# Patient Record
Sex: Female | Born: 1985 | Race: Asian | Hispanic: No | Marital: Married | Smoking: Never smoker
Health system: Southern US, Community
[De-identification: ages and names within clinical notes are randomized; demographics above are authoritative.]

## PROBLEM LIST (undated history)

## (undated) ENCOUNTER — Inpatient Hospital Stay (HOSPITAL_COMMUNITY): Payer: Self-pay

## (undated) DIAGNOSIS — Z331 Pregnant state, incidental: Secondary | ICD-10-CM

## (undated) DIAGNOSIS — Z789 Other specified health status: Secondary | ICD-10-CM

## (undated) HISTORY — PX: AUGMENTATION MAMMAPLASTY: SUR837

## (undated) HISTORY — PX: BREAST ENHANCEMENT SURGERY: SHX7

---

## 2010-05-03 HISTORY — PX: BREAST IMPLANT: SHX2716

## 2011-11-10 ENCOUNTER — Ambulatory Visit (INDEPENDENT_AMBULATORY_CARE_PROVIDER_SITE_OTHER): Payer: Exclusive Provider Organization | Admitting: Specialist

## 2011-11-10 ENCOUNTER — Encounter (INDEPENDENT_AMBULATORY_CARE_PROVIDER_SITE_OTHER): Payer: Self-pay | Admitting: Specialist

## 2011-11-10 VITALS — BP 104/56 | HR 80 | Temp 97.4°F | Ht 68.0 in | Wt 125.1 lb

## 2011-11-10 DIAGNOSIS — L259 Unspecified contact dermatitis, unspecified cause: Secondary | ICD-10-CM

## 2011-11-10 DIAGNOSIS — Z01419 Encounter for gynecological examination (general) (routine) without abnormal findings: Secondary | ICD-10-CM

## 2011-11-10 DIAGNOSIS — Z113 Encounter for screening for infections with a predominantly sexual mode of transmission: Secondary | ICD-10-CM

## 2011-11-10 DIAGNOSIS — B001 Herpesviral vesicular dermatitis: Secondary | ICD-10-CM

## 2011-11-10 DIAGNOSIS — L309 Dermatitis, unspecified: Secondary | ICD-10-CM

## 2011-11-10 DIAGNOSIS — B009 Herpesviral infection, unspecified: Secondary | ICD-10-CM

## 2011-11-10 MED ORDER — CICLOPIROX OLAMINE 0.77 % EX CREA
TOPICAL_CREAM | Freq: Two times a day (BID) | CUTANEOUS | Status: AC
Start: 2011-11-10 — End: 2012-11-09

## 2011-11-10 MED ORDER — VALACYCLOVIR HCL 1 G PO TABS
2000.00 mg | ORAL_TABLET | Freq: Two times a day (BID) | ORAL | Status: AC
Start: 2011-11-10 — End: 2011-11-11

## 2011-11-10 NOTE — Progress Notes (Signed)
Subjective:          HPI   Patient ID: Julia Carlson is a 26 y.o. Female G0 LMP: 6/20/213  Presents for well woman gynecological exam and Pap. Requests STD testing. In monogamous relationship since 2/13.  BC: condoms. Declines need for any other form of birthcontrol. Currently has coldsore on lower lip. Gets them when weather changes.      Gyn History:      Last Pap:  11/2010--- normal  History of abnormal Pap: No    Monthly breast exams:  No    Menstrual History:    Regular   # Days between periods:  28 days  # of days periods last:    Spotting between periods: No   Any problems with cycle:  No      Contraceptive History:    Current Contraception:  Condoms  Past forms of Contraception:  OCPs      Sexual History:    # of Lifetime sexual partners:   # of times is sexually active per month:    Condom use:  Yes   STD history: None    STD testing: Yes    History reviewed. No pertinent past medical history.  Past Surgical History   Procedure Date   . Breast implant 05/2010     Meds: None    Allergies   Allergen Reactions   . Ciprofloxacin Rash           History     Social History   . Marital Status: Single     Spouse Name: N/A     Number of Children: 0   . Years of Education: N/A     Occupational History   . Not on file.     Social History Main Topics   . Smoking status: Never Smoker    . Smokeless tobacco: No   . Alcohol Use: Yes      occ   . Drug Use: No   . Sexually Active: Yes       Family History   Problem Relation Age of Onset   . Diabetes Father      .   Arthritis                                                                                       Mother        Review of Systems  ROS    Constitutional: No fever, unexplained weight change, trouble sleeping or unusual fatigue or fever  Head: Negative for migraine headaches or waking up with headaches. Reports occasional tension headaches.   Eyes: Negative for blurred vision or trouble with eyes.  Ears, Nose, Mouth and Throat: Negative for trouble with ears or hearing,  nosebleeds or trouble with nose or sinuses.  Respiratory: Negative for trouble breathing, shortness of breath, coughing spells or phlegm.  Cardiovascular: Negative for chest pain, irregular and / or rapid heartbeat.  Gastrointestinal: Negative for nausea, vomiting, bloating or excess gas, heartburn, indigestion, constipation, diarrhea, involuntary loss of gas or stool, feeling of incomplete evacuation of stools after bowel movement or blood in stools.  Genitourinary: Negative for pain or bleeding  with intercourse,abnormal vaginal discharge, vaginal odor, itching or dryness, abnormal bleeding, irregular or heavy periods, severe cramping or pelvic or abdominal pain during periods or prolonged periods > 7 days.  Denies urinary frequency and / or urgency, feeling of incomplete emptying, blood in the urine. Denies frequent urine loss with coughing, sneezing, laughing, exercise or lifting or involuntary urine loss or bedwetting.  Integument/Breast: C/o itching right above scar onright breast. Negative for breast pain, nipple discharge, breast lump(s)  Hematologic/Lymphatic: Negative for frequent bruising or prolonged bleeding.  Musculoskeletal: Negative for muscle or joint pain or muscle weakness.   Neurological: Negative for dizzy spells, fainting, seizures, epilepsy or trouble with balance.  Behavioral/Psych: Negative for depression or anxiety. Denies decreased sex drive or difficulty achieving orgasm. Denies domestic violence or sexual assault or rape.  Endocrine: Negative for heat or cold intolerance, hair loss and / or  thinning, increased body of facial hair growth, hot flashes or night sweats. Denies unusual fatigue.  Allergic/Immunologic: Negative for drug and/or food allergies or sensitivities.      Objective:    Physical Exam       Constitutional: She is oriented to person, place, and time. She appears well-developed and well-nourished. No distress.   HEENT: Within normal limits, normocephalic and atraumatic  Pupils are equal, round, and reactive to light.   Neck:  Supple. No thyromegaly present.   Cardiovascular: Normal rate, regular rhythm and normal heart sounds. No murmurs.   Pulmonary: Effort normal and breath sounds normal. No respiratory distress,  no wheezes or  rales.   Breasts: Symmetrical, hypertrophic scars bilaterally, annular skin lesion under right nipple and above scar, brownish discoloration of skin inside the annular lesion,  non-tender, no masses, no adenopathy,   or nipple discharge.  Abdominal: She exhibits no distension, no masses and no hepatosplenomegaly.  Non-tender and there is no rebound or guarding.  Genitourinary:   External: Normal female external genitalia, no lesions  BUS: within normal limits.  Vagina: No masses, discharge or lesions.  Cervix:  Nulliparous, No discharge, lesions, friability or cervical motion tenderness.  Uterus: Normal size, shape, position and contour, non-tender and mobile.  Adnexae: non-tender, no masses.  Lower extremities: No edema, cyanosis, clubbing or tenderness and negative Homan's sign bilaterally.   Lymphadenopathy: She has no cervical, axillary or inguinal adenopathy.   Neurological: She is alert and oriented to person, place, and time.   Skin: Skin is warm, dry and intact. No lesions noted.   Psychiatric: She has a normal mood and affect.       Assessment & Plan:       .  1. Well woman exam with routine gynecological exam  CBC and differential, Comprehensive Metabolic Panel, SurePath-FPGS,CT-NG with Reflex HPV   2. Screening examination for STD (sexually transmitted disease)  HIV Antibodies, HIV-1/2, EIA with Reflex, Hepatitis B Surface Antigen, Hepatitis C Antibody, RPR, HSV 2, Glycoprotein G ABS, IgG, Lipid panel, SurePath-FPGS,CT-NG with Reflex HPV   3. Herpes labialis  valacyclovir (VALTREX) 1000 MG tablet   4. Dermatitis of right breast  ciclopirox (LOPROX) 0.77 % cream   5. Counseling done and screening advice as per below:         SCREENING TESTS FOR  PERIODIC EXAMINATIONS IN  HEALTHY WOMEN  Age 79-39 (childbearing years):  Marland Kitchen Nonfasting total blood cholesterol (every 5 years).  . Clinical breast exam (every 1 to 3 years).  . Pelvic exam and Pap smear (every 1 to 3 years).  . STD testing screen for  sexually active women under 25.  Marland Kitchen Dental exam (every 6 to 12 months).  . SBE T&E    RECOMMENDED IMMUNIZATIONS  . Tetanus-diphtheria booster (once between ages 52 to 38,  then a booster every 10 years).        Alla German, M.D., FACOG  [address]  [place], [place]  [address]  [phone]  Fax: [phone]  [email or web address]    . Influenza vaccine--annually

## 2011-11-12 LAB — CBC AND DIFFERENTIAL
Atypical Lymphocytes %: 0 %
Baso(Absolute): 31 cells/uL (ref 0–200)
Basophils: 0.6 %
Eosinophils Absolute: 56 cells/uL (ref 15–500)
Eosinophils: 1.1 %
Hematocrit: 41.5 % (ref 35.0–45.0)
Hemoglobin: 13.7 g/dL (ref 11.7–15.5)
Lymphocytes Absolute: 1887 cells/uL (ref 850–3900)
Lymphocytes: 37 %
MCH: 30.3 pg (ref 27–33)
MCHC: 33 g/dL (ref 32–36)
MCV: 92 fL (ref 80–100)
MPV: 8.1 fL (ref 7.5–11.5)
Monocytes Absolute: 326 cells/uL (ref 200–950)
Monocytes: 6.4 %
Neutrophils Absolute: 2800 cells/uL (ref 1500–7800)
Neutrophils: 54.9 %
Platelets: 220 10*3/uL (ref 140–400)
RBC: 4.52 10*6/uL (ref 3.80–5.10)
RDW: 12.8 % (ref 11.0–15.0)
WBC: 5.1 10*3/uL (ref 3.8–10.8)

## 2011-11-12 LAB — HEPATITIS B SURFACE ANTIGEN W/ REFLEX TO CONFIRMATION: Hepatitis B Surface Antigen: NONREACTIVE

## 2011-11-12 LAB — RPR: RPR: NONREACTIVE

## 2011-11-12 LAB — COMPREHENSIVE METABOLIC PANEL
ALT: 10 U/L (ref 6–29)
AST (SGOT): 12 U/L (ref 10–30)
Albumin/Globulin Ratio: 1.5 (ref 1.0–2.5)
Albumin: 4.5 G/DL (ref 3.6–5.1)
Alkaline Phosphatase: 38 U/L (ref 33–115)
BUN: 15 MG/DL (ref 7–25)
Bilirubin, Total: 0.5 MG/DL (ref 0.2–1.2)
CO2: 22 mmol/L (ref 19–30)
Calcium: 9.2 MG/DL (ref 8.6–10.2)
Chloride: 108 mmol/L (ref 98–110)
Creatinine: 0.68 mg/dL (ref 0.50–1.10)
EGFR African American: 140 mL/min/{1.73_m2} (ref >=60)
EGFR: 121 mL/min/{1.73_m2} (ref >=60)
Globulin: 3.1 G/DL (ref 1.9–3.7)
Glucose: 65 MG/DL (ref 65–99)
Potassium: 4.5 mmol/L (ref 3.5–5.3)
Protein, Total: 7.6 G/DL (ref 6.1–8.1)
Sodium: 142 mmol/L (ref 135–146)

## 2011-11-12 LAB — HSV 2 IGG: Herpes Antibody Type 2 IgG: 0.1 Index Value

## 2011-11-12 LAB — LIPID PANEL
Cholesterol / HDL Ratio: 2 (ref 0.0–5.0)
Cholesterol: 157 MG/DL (ref 125–200)
HDL: 79 MG/DL (ref >=46)
LDL Calculated: 68 MG/DL (ref <130)
Non HDL Cholesterol (LDL and VLDL): 78 mg/dL
Triglycerides: 49 MG/DL (ref <150)

## 2011-11-12 LAB — HEPATITIS C ANTIBODY
HCV SIGNAL TO CUTOFF: 0.04
Hepatitis C, AB: NONREACTIVE

## 2011-11-12 LAB — HIV ANTIBODIES, HIV-1/2, EIA WITH REFLEXES: HIV 1/2 Antibody: NONREACTIVE

## 2011-11-15 LAB — CT-GC DNA SDA,PAP VIAL
C. Trachomatis DNA, SDA, OTV: NOT DETECTED
N.GONORRHOEAE DNA,SDA,OTV: NOT DETECTED

## 2011-11-15 LAB — SUREPATH-FPGS,CT-NG W-RFX HPV

## 2012-01-24 ENCOUNTER — Encounter (INDEPENDENT_AMBULATORY_CARE_PROVIDER_SITE_OTHER): Payer: Self-pay | Admitting: Specialist

## 2012-11-28 ENCOUNTER — Encounter (INDEPENDENT_AMBULATORY_CARE_PROVIDER_SITE_OTHER): Payer: Exclusive Provider Organization | Admitting: Specialist

## 2012-12-28 ENCOUNTER — Ambulatory Visit (INDEPENDENT_AMBULATORY_CARE_PROVIDER_SITE_OTHER): Payer: Exclusive Provider Organization | Admitting: Specialist

## 2012-12-28 ENCOUNTER — Encounter (INDEPENDENT_AMBULATORY_CARE_PROVIDER_SITE_OTHER): Payer: Self-pay | Admitting: Specialist

## 2012-12-28 VITALS — BP 96/62 | HR 78 | Temp 98.2°F | Ht 67.0 in | Wt 119.2 lb

## 2012-12-29 LAB — HIV ANTIBODIES, HIV-1/2, EIA WITH REFLEXES: HIV 1/2 Antibody: NONREACTIVE

## 2012-12-29 LAB — HEPATITIS C ANTIBODY
HCV SIGNAL TO CUTOFF: 0.05
Hepatitis C, AB: NONREACTIVE

## 2012-12-29 LAB — HEPATITIS B SURFACE ANTIGEN W/ REFLEX TO CONFIRMATION: Hepatitis B Surface Antigen: NONREACTIVE

## 2012-12-29 LAB — TREPONEMA PALLIDUM (SYPHILIS) ANTIBODY: RPR: NONREACTIVE

## 2012-12-29 LAB — HSV 2 IGG: Herpes Antibody Type 2 IgG: 0.2

## 2013-01-01 NOTE — Progress Notes (Signed)
Subjective:       Patient ID: Julia Carlson is a 27 y.o. female.    HPI    27 y.o. Female G0  Chief Complaint   Patient presents with   . Annual Exam       Gyn History    Patient's last menstrual period was 12/18/2012.  Last Pap: was normal    Menstrual History   . Age of menarche: 43    . Menstrual Flow Moderate    . Period Pattern Regular    . Period Duration in Days 5    . If Dysmenorrhea, Severity Level Moderate        Pt reports that she does do monthly self breast exams.      Current Contraception:      condoms    Sexual History:    single partner, contraception - condoms most of the time  # of Lifetime sexual partners: 60  Female partners  Monogamous relationship   # of times is sexually active per month:  8  STD history:   The patient denies history of sexually transmitted disease.  STD testing:  Wants to be tested for STDs.    Obstetrical History:       OB History     Grav Para Term Preterm Abortions TAB SAB Ect Mult Living    0                   Past Medical  & Family History:    History reviewed. No pertinent past medical history.  Family History   Problem Relation Age of Onset   . Diabetes Father    . Diabetes Maternal Grandmother    . Breast cancer Maternal Grandmother    . Breast cancer Maternal Aunt        Past Surgical History:    Past Surgical History   Procedure Date   . Breast implant 05/2010       Immunizations:      There is no immunization history on file for this patient.                                  Social History:        History     Social History   . Marital Status: Single     Spouse Name: N/A     Number of Children: 0   . Years of Education: N/A     Occupational History   . Not on file.     Social History Main Topics   . Smoking status: Never Smoker    . Smokeless tobacco: Never Used   . Alcohol Use: No   . Drug Use: No   . Sexually Active: Yes -- Female partner(s)     Other Topics Concern   . Not on file     Social History Narrative   . No narrative on file         No current outpatient  prescriptions on file.        Allergies:   Allergies   Allergen Reactions   . Ciprofloxacin Rash         Review of Systems     Constitutional: No fever, unexplained weight change, trouble sleeping or unusual fatigue  Head: Negative for migraine headaches or waking up with headaches.    Eyes: Negative for blurred vision or trouble with eyes.  Ears, Nose,  Mouth and Throat: Negative for trouble with ears or hearing, nosebleeds or trouble with nose or sinuses.  Respiratory: Negative for trouble breathing, shortness of breath, coughing spells or phlegm.  Cardiovascular: Negative for chest pain, irregular and / or rapid heartbeat.  Gastrointestinal: Negative for nausea, vomiting, bloating or excess gas, heartburn, indigestion, constipation, diarrhea, involuntary loss of gas or stool, feeling of incomplete evacuation of stools after bowel movement or blood in stools.  Genitourinary: Negative for pain or bleeding with intercourse,abnormal vaginal discharge, vaginal odor, itching or dryness. No abnormal bleeding, irregular or heavy periods, severe cramping or pelvic or abdominal pain during periods or prolonged periods > 7 days. Denies urinary frequency and / or urgency, feeling of incomplete emptying, blood in the urine. Denies frequent urine loss with coughing, sneezing, laughing, exercise or lifting or involuntary urine loss or bedwetting.  Integument/Breast:  Negative for breast pain, nipple discharge, breast lump(s).  Hematologic/Lymphatic: Negative for frequent bruising or prolonged bleeding.  Musculoskeletal: Negative for muscle or joint pain or muscle weakness.   Neurological: Negative for dizzy spells, fainting, seizures, epilepsy or trouble with balance.  Behavioral/Psych: Negative for depression or anxiety. Denies decreased sex drive or difficulty achieving orgasm. Denies domestic violence or sexual assault or rape.  Endocrine: Negative for heat or cold intolerance, hair loss and / or  thinning, increased body of  facial hair growth, hot flashes or night sweats.       Objective:    Physical Exam  BP 96/62  Pulse 78  Temp 98.2 F (36.8 C) (Oral)  Ht 1.702 m (5\' 7" )  Wt 54.069 kg (119 lb 3.2 oz)  BMI 18.67 kg/m2  LMP 12/18/2012    Constitutional: She is oriented to person, place, and time. She appears well-developed and well-nourished. No distress.   HEENT: Within normal limits, normocephalic and atraumatic. Neck is supple. No thyromegaly present.   Cardiovascular: Normal rate, regular rhythm and normal heart sounds. No murmurs.   Pulmonary: Effort normal and breath sounds normal. No respiratory distress,  no wheezes or  rales.   Breasts: Symmetric, bilateral implants, well healed scars. No skin changes, non-tender, no masses, no adenopathy,   no nipple discharge or lesions.    Abdominal: She exhibits no distension, no masses and no hepatosplenomegaly.  Non-tender and there is no rebound or guarding. No CVA tenderness.  Genitourinary:   External: Normal female external genitalia, no lesions  BUS: within normal limits.  Vagina: No masses, discharge or lesions.  Cervix:  Nulliparous, No discharge, lesions, friability or cervical motion tenderness.  Uterus: Normal size, shape, position and contour, non-tender and mobile.  Adnexae: non-tender, no masses.  Lower extremities: No edema, cyanosis, clubbing or tenderness and negative Homan's sign bilaterally.   Lymphadenopathy: She has no cervical, axillary or inguinal adenopathy.   Neurological: She is alert and oriented to person, place, and time.   Skin: Skin is warm, dry and intact. No lesions noted.   Psychiatric: She has a normal mood and affect.         Assessment & Plan      1. Well woman exam with routine gynecological exam  ThinPrep Pap with Reflex HPV mRNA E6/E7 with GC/CT     2. Screening for STD (sexually transmitted disease)  HIV Ab,HIV-1/HIV-2,EIA w/Rflx HIV-1 WB    Hepatitis B surface antigen    Hepatitis C antibody    RPR, Rfx Qn RPR/Confirm T. Pallidum    HSV 2,  Glycoprotein G ABS, IgG  3. WOMEN'S HEALTH EXAMINATIONS & IMMUNIZATIONS    BASIC INFORMATION    DESCRIPTION  . An annual overall physical examination for healthy adults of  all ages is not recommended by most medical experts.  However, there are certain periodic screening tests recommended  for healthy women based on risk factors and preventive  services. In addition, your doctor will probably discuss  health-related behaviors such as smoking cessation, alcohol  use, contraception (if appropriate), eating habits and weight  problems, exercise programs, sexual activity (to assess risk of  sexually transmitted diseases), and seat belt use.    . Vaccine-preventable diseases cause needless sickness and  death in adults. Women may not be aware that they need  immunizations or they are not sure about their immunization  history. In addition, women are sometimes concerned about  possible adverse reactions to immunizations.    SCREENING TESTS FOR PERIODIC EXAMINATIONS IN  HEALTHY WOMEN    Age 6-39 (childbearing years):    Marland Kitchen Height and weight.  . Blood pressure.  . Nonfasting total blood cholesterol (every 5 years).  . Clinical breast exam (every 1 to 3 years).  . Pelvic exam and Pap smear (every 1 to 3 years).  . Chlamydia screen for sexually active women under 25.  Marland Kitchen Dental exam (every 6 to 12 months).    RECOMMENDED IMMUNIZATIONS  . Tetanus-diphtheria booster (once between ages 19 to 73,  then a booster every 10 years).  . Influenza vaccine--annually; or ask your doctor.  . Pneumococcal vaccine--below age 15, depends on chronic  diseases or special conditions; over 65, needed only one time.  . Rubella vaccine--once, for women of childbearing age without  proof of immunity.  . Hepatitis B vaccine--once, for women in health care occupations  or working with blood, intravenous drug users, those  having multiple sexual partners or having sex with a hepatitis  B-infected person.  Marland Kitchen HIV patients should be evaluated for  all immunizations.    OTHER TESTS THAT MAY BE RECOMMENDED FOR WOMEN  WITH RISK FACTORS    . Skin exam--for excessive skin exposure to sun or precancerous  skin changes.  . Blood test for hemoglobin--heavy menstrual periods; women  of [place], [place], [place], [place] or African  descent.  . Urine test for infection--diabetes mellitus.  . Sexually transmitted disease (STD) tests--for women having  sex with multiple partners or a partner with multiple partners;  sexual contact with a person who has or has had an STD.  Marland Kitchen Human immunodeficiency virus (HIV)--women being treated  for another STD, intravenous drug user, women having current  or past sexual activity with an HIV positive person or one  who injects drugs.  . Genetic testing--reproductive age women with risk factors.  . Tuberculosis (TB) skin test--infection with HIV, living or  working with someone with TB or other risk factors for TB  exposure.  . Blood test for type of lipids (cholesterol)--women with high  cholesterol, having a close relative with high cholesterol, diabetes  mellitus, smoking, family history of heart disease.  Willey Blade below age 86 years if mother or sister  has been diagnosed with breast cancer.  . Fasting blood glucose (sugar) test--family history of diabetes  mellitus, being very overweight, having had diabetes in pregnancy.  . Thyroid-stimulating hormone test (for thyroid function)--family  history of thyroid disease, having an autoimmune disease.    NOTIFY OUR OFFICE IF  You have questions about examinations or immunizations or  Want to schedule an appointment for  screening tests

## 2013-01-02 ENCOUNTER — Other Ambulatory Visit (INDEPENDENT_AMBULATORY_CARE_PROVIDER_SITE_OTHER): Payer: Self-pay | Admitting: Specialist

## 2013-01-04 LAB — THINPREP PAP WITH REFLEX HPV MRNA E6/E7

## 2013-04-12 ENCOUNTER — Ambulatory Visit (INDEPENDENT_AMBULATORY_CARE_PROVIDER_SITE_OTHER): Payer: Commercial Managed Care - POS | Admitting: Specialist

## 2013-04-12 ENCOUNTER — Other Ambulatory Visit (INDEPENDENT_AMBULATORY_CARE_PROVIDER_SITE_OTHER): Payer: Self-pay | Admitting: Specialist

## 2013-04-12 ENCOUNTER — Encounter (INDEPENDENT_AMBULATORY_CARE_PROVIDER_SITE_OTHER): Payer: Self-pay | Admitting: Specialist

## 2013-04-12 VITALS — BP 97/62 | HR 70 | Temp 98.5°F | Wt 117.0 lb

## 2013-04-12 MED ORDER — NYSTATIN-TRIAMCINOLONE 100000-0.1 UNIT/GM-% EX OINT
TOPICAL_OINTMENT | Freq: Two times a day (BID) | CUTANEOUS | Status: DC
Start: 2013-04-12 — End: 2014-06-23

## 2013-04-12 MED ORDER — FLUCONAZOLE 150 MG PO TABS
150.0000 mg | ORAL_TABLET | Freq: Once | ORAL | Status: AC
Start: 2013-04-12 — End: 2013-04-12

## 2013-04-17 ENCOUNTER — Encounter (INDEPENDENT_AMBULATORY_CARE_PROVIDER_SITE_OTHER): Payer: Self-pay | Admitting: Specialist

## 2013-04-17 NOTE — Progress Notes (Signed)
S: c/o vaginal discharge, white and itching. Has had 2 yeast infections in past month. Denies any new use of soaps, detergents, lubricants, antibiotic use.  LMP: 03/23/13. Sexually active in monogamous relationship. Uses condoms.    History reviewed. No pertinent past medical history.    Current Meds: None  Allergies:   Allergies   Allergen Reactions   . Ciprofloxacin Rash       Reviewed and no interval change since last visit.  The following portions of the patient's history were reviewed and updated as appropriate: allergies, current medications, past family history, past medical history, past social history, past surgical history and problem list.    ROS: Denies fever, chills, headache, visual changes, lightheadedness, dizziness, SOB, chest pain, palpitations, N/V, diarrhea, constipation, dysuria, hematuria, purulent vaginal discharge, abnormal vaginal bleeding or pelvic pain.       O: BP 97/62  Pulse 70  Temp 98.5 F (36.9 C) (Oral)  Wt 53.071 kg (117 lb)  LMP 03/23/2013  Constitutional: She is oriented to person, place, and time. She appears well-developed and well-nourished. No distress.   Genitourinary:   External: Normal female external genitalia, no lesions  BUS: within normal limits.  Vagina: No masses, whitish discharge in vault.   Cervix:  Nulliparous, No discharge, lesions, friability or cervical motion tenderness.  Uterus: Normal size, shape, position and contour, non-tender and mobile.  Adnexae: non-tender, no masses.      A/P:   1. Recurrent vaginitis  CBC    ANA Screen with Reflex Endpoint Titer    Comprehensive Metabolic Panel    SureSwab(TM) Plus     fluconazole (DIFLUCAN) 150 MG tablet    nystatin-triamcinolone (MYCOLOG) ointment

## 2013-04-18 LAB — SURESWAB(TM) PLUS
Atopobium vaginae: 6.4 Log (cells/mL)
BV Category: UNDETERMINED — AB
C.trachomatis RNA,TMA: NOT DETECTED
Candida Glabrata,DNA: NOT DETECTED
Candida Parapsilosis,DNA: NOT DETECTED
Candida Tropicalis,DNA: NOT DETECTED
Candida albicans DNA: DETECTED — AB
Gardnerella vaginalis: 7.2 Log (cells/mL)
Lactobacillus species: 7.6 Log (cells/mL)
Megasphaera species: NOT DETECTED
Neisseria gonorrhoeae by PCR: NOT DETECTED
Trichomonas Vaginalis RNA, QL TMA: NOT DETECTED

## 2013-04-18 LAB — CBC
Hematocrit: 43.3 % (ref 35.0–45.0)
Hemoglobin: 14.5 g/dL (ref 11.7–15.5)
MCH: 29.7 pg (ref 27–33)
MCHC: 33.4 g/dL (ref 32–36)
MCV: 89 fL (ref 80–100)
MPV: 8.5 fL (ref 7.5–11.5)
Platelets: 240 10*3/uL (ref 140–400)
RBC: 4.87 10*6/uL (ref 3.80–5.10)
RDW: 12.4 % (ref 11.0–15.0)
WBC: 4.3 10*3/uL (ref 3.8–10.8)

## 2013-04-18 LAB — COMPREHENSIVE METABOLIC PANEL
ALT: 10 U/L (ref 6–29)
AST (SGOT): 13 U/L (ref 10–30)
Albumin/Globulin Ratio: 1.4 (ref 1.0–2.5)
Albumin: 4.6 G/DL (ref 3.6–5.1)
Alkaline Phosphatase: 35 U/L (ref 33–115)
BUN: 11 MG/DL (ref 7–25)
Bilirubin, Total: 0.5 MG/DL (ref 0.2–1.2)
CO2: 24 mmol/L (ref 19–30)
Calcium: 9.5 MG/DL (ref 8.6–10.2)
Chloride: 107 mmol/L (ref 98–110)
Creatinine: 0.68 mg/dL (ref 0.50–1.10)
EGFR African American: 139 mL/min/{1.73_m2} (ref >=60)
EGFR: 120 mL/min/{1.73_m2} (ref >=60)
Globulin: 3.4 G/DL (ref 1.9–3.7)
Glucose: 93 MG/DL (ref 65–99)
Potassium: 4.5 mmol/L (ref 3.5–5.3)
Protein, Total: 8 G/DL (ref 6.1–8.1)
Sodium: 139 mmol/L (ref 135–146)

## 2013-04-18 LAB — ANA SCREEN WITH REFLEX ENDPOINT TITER: ANA Screen, IFA: NEGATIVE

## 2013-05-15 ENCOUNTER — Encounter (INDEPENDENT_AMBULATORY_CARE_PROVIDER_SITE_OTHER): Payer: Self-pay | Admitting: Specialist

## 2013-05-16 ENCOUNTER — Other Ambulatory Visit (INDEPENDENT_AMBULATORY_CARE_PROVIDER_SITE_OTHER): Payer: Self-pay | Admitting: Specialist

## 2013-05-16 DIAGNOSIS — N760 Acute vaginitis: Secondary | ICD-10-CM

## 2013-05-16 MED ORDER — FLUCONAZOLE 150 MG PO TABS
150.0000 mg | ORAL_TABLET | Freq: Once | ORAL | Status: AC
Start: 2013-05-16 — End: 2013-05-16

## 2013-06-20 ENCOUNTER — Encounter (INDEPENDENT_AMBULATORY_CARE_PROVIDER_SITE_OTHER): Payer: Self-pay | Admitting: Specialist

## 2013-06-20 ENCOUNTER — Ambulatory Visit (INDEPENDENT_AMBULATORY_CARE_PROVIDER_SITE_OTHER): Payer: Commercial Managed Care - POS | Admitting: Specialist

## 2013-06-20 VITALS — BP 108/64 | HR 87 | Temp 98.7°F | Ht 68.0 in | Wt 121.0 lb

## 2013-06-20 DIAGNOSIS — N760 Acute vaginitis: Secondary | ICD-10-CM

## 2013-06-20 MED ORDER — METRONIDAZOLE 500 MG PO TABS
500.0000 mg | ORAL_TABLET | Freq: Two times a day (BID) | ORAL | Status: AC
Start: 2013-06-20 — End: 2013-06-27

## 2013-06-20 MED ORDER — FLUCONAZOLE 150 MG PO TABS
150.0000 mg | ORAL_TABLET | Freq: Once | ORAL | Status: AC
Start: 2013-06-20 — End: 2013-06-20

## 2013-06-20 NOTE — Progress Notes (Signed)
S: Pt. C/o vaginal discharge, white, thick, slight odor with itching and irritation. Using RepHresh OTC and probiotics    LMP: 05/24/13  Contraception: Condoms    Reviewed and no interval change since last visit.  The following portions of the patient's history were reviewed and updated as appropriate: allergies, current medications, past family history, past medical history, past social history, past surgical history and problem list.    ROS:  Positive for abnormal vaginal discharge.  Denies fever, chills, headache, visual changes, lightheadedness, dizziness, SOB, chest pain, palpitations, N/V, diarrhea, constipation, dysuria, hematuria, abnormal vaginal bleeding or pelvic pain.       Current outpatient prescriptions:fluconazole (DIFLUCAN) 150 MG tablet, Take 1 tablet (150 mg total) by mouth once., Disp: 1 tablet, Rfl: 0;  metroNIDAZOLE (FLAGYL) 500 MG tablet, Take 1 tablet (500 mg total) by mouth 2 (two) times daily., Disp: 14 tablet, Rfl: 0;  nystatin-triamcinolone (MYCOLOG) ointment, Apply topically 2 (two) times daily., Disp: 30 g, Rfl: 0      O:BP 108/64  Pulse 87  Temp 98.7 F (37.1 C) (Oral)  Ht 1.727 m (5\' 8" )  Wt 54.885 kg (121 lb)  BMI 18.40 kg/m2  LMP 05/24/2013    Constitutional: She is oriented to person, place, and time. She appears well-developed and well-nourished. No distress.   Genitourinary:   External: Normal female external genitalia, no lesions  BUS: within normal limits.  Vagina: White, thick discharge. No masses or lesions.  Cervix:  Nulliparous, No discharge, lesions, friability or cervical motion tenderness.  Uterus: Normal size, shape, position and contour, non-tender and mobile.    A/P:    1. Recurrent vaginitis  fluconazole (DIFLUCAN) 150 MG tablet po x 1     metroNIDAZOLE (FLAGYL) 500 MG tablet po bid x 7 days    SureSwab(TM) Plus  Continue probiotics and RepHresh OTC  Diflucan 150 mg weekly for 3 months if reoccurs

## 2013-06-23 LAB — SURESWAB(TM) PLUS
Atopobium vaginae: 5.8 Log (cells/mL)
BV Category: UNDETERMINED — AB
C.trachomatis RNA,TMA: NOT DETECTED
Candida Glabrata,DNA: NOT DETECTED
Candida Parapsilosis,DNA: NOT DETECTED
Candida Tropicalis,DNA: NOT DETECTED
Candida albicans DNA: DETECTED — AB
Gardnerella vaginalis: 7.5 Log (cells/mL)
Lactobacillus species: 7.8 Log (cells/mL)
Megasphaera species: NOT DETECTED
Neisseria gonorrhoeae by PCR: NOT DETECTED
Trichomonas Vaginalis RNA, QL TMA: NOT DETECTED

## 2013-07-24 ENCOUNTER — Encounter (INDEPENDENT_AMBULATORY_CARE_PROVIDER_SITE_OTHER): Payer: Self-pay | Admitting: Specialist

## 2013-07-25 ENCOUNTER — Other Ambulatory Visit (INDEPENDENT_AMBULATORY_CARE_PROVIDER_SITE_OTHER): Payer: Self-pay | Admitting: Specialist

## 2013-07-25 DIAGNOSIS — N761 Subacute and chronic vaginitis: Secondary | ICD-10-CM

## 2013-07-25 MED ORDER — FLUCONAZOLE 150 MG PO TABS
150.0000 mg | ORAL_TABLET | Freq: Once | ORAL | Status: AC
Start: 2013-07-25 — End: 2013-07-25

## 2013-12-04 LAB — GONOCOCCUS CULTURE
Chlamydia trachomatis Culture: NEGATIVE
Culture Gonorrhoeae: NEGATIVE

## 2014-01-02 LAB — HEPATITIS B SURFACE ANTIGEN W/ REFLEX TO CONFIRMATION: Hepatitis B Surface Antigen: NEGATIVE

## 2014-01-02 LAB — RPR: RPR: NONREACTIVE

## 2014-01-02 LAB — ANTIBODY SCREEN: AB Screen Gel: NOT DETECTED

## 2014-01-02 LAB — ABO/RH: ABO Rh: O POS

## 2014-01-02 LAB — HIV AG/AB 4TH GENERATION: HIV Ag/Ab, 4th Generation: NONREACTIVE

## 2014-01-02 LAB — RUBELLA ANTIBODY, IGG: Rubella AB, IgG: IMMUNE

## 2014-01-08 ENCOUNTER — Ambulatory Visit (INDEPENDENT_AMBULATORY_CARE_PROVIDER_SITE_OTHER): Payer: Commercial Managed Care - POS | Admitting: Specialist

## 2014-05-03 DIAGNOSIS — O4100X0 Oligohydramnios, unspecified trimester, not applicable or unspecified: Secondary | ICD-10-CM

## 2014-05-03 HISTORY — DX: Oligohydramnios, unspecified trimester, not applicable or unspecified: O41.00X0

## 2014-05-04 ENCOUNTER — Inpatient Hospital Stay (HOSPITAL_COMMUNITY): Payer: Managed Care, Other (non HMO)

## 2014-05-04 ENCOUNTER — Inpatient Hospital Stay (HOSPITAL_COMMUNITY)
Admission: AD | Admit: 2014-05-04 | Discharge: 2014-05-04 | Disposition: A | Discharge status: Home / Self Care | Payer: Managed Care, Other (non HMO) | Source: Ambulatory Visit | Attending: Obstetrics and Gynecology | Admitting: Obstetrics and Gynecology

## 2014-05-04 ENCOUNTER — Encounter (HOSPITAL_COMMUNITY): Payer: Self-pay

## 2014-05-04 DIAGNOSIS — O26873 Cervical shortening, third trimester: Secondary | ICD-10-CM | POA: Insufficient documentation

## 2014-05-04 DIAGNOSIS — Z3A30 30 weeks gestation of pregnancy: Secondary | ICD-10-CM | POA: Insufficient documentation

## 2014-05-04 DIAGNOSIS — M79609 Pain in unspecified limb: Secondary | ICD-10-CM

## 2014-05-04 DIAGNOSIS — O4703 False labor before 37 completed weeks of gestation, third trimester: Secondary | ICD-10-CM

## 2014-05-04 DIAGNOSIS — I82402 Acute embolism and thrombosis of unspecified deep veins of left lower extremity: Secondary | ICD-10-CM

## 2014-05-04 DIAGNOSIS — O2203 Varicose veins of lower extremity in pregnancy, third trimester: Secondary | ICD-10-CM | POA: Diagnosis not present

## 2014-05-04 DIAGNOSIS — M79605 Pain in left leg: Secondary | ICD-10-CM | POA: Diagnosis present

## 2014-05-04 DIAGNOSIS — O4700 False labor before 37 completed weeks of gestation, unspecified trimester: Secondary | ICD-10-CM | POA: Insufficient documentation

## 2014-05-04 DIAGNOSIS — O479 False labor, unspecified: Secondary | ICD-10-CM | POA: Insufficient documentation

## 2014-05-04 HISTORY — DX: Other specified health status: Z78.9

## 2014-05-04 LAB — URINE MICROSCOPIC-ADD ON

## 2014-05-04 LAB — URINALYSIS, ROUTINE W REFLEX MICROSCOPIC
BILIRUBIN URINE: NEGATIVE
Glucose, UA: NEGATIVE mg/dL
Hgb urine dipstick: NEGATIVE
Ketones, ur: NEGATIVE mg/dL
Nitrite: NEGATIVE
Protein, ur: NEGATIVE mg/dL
Specific Gravity, Urine: 1.01 (ref 1.005–1.030)
Urobilinogen, UA: 0.2 mg/dL (ref 0.0–1.0)
pH: 7 (ref 5.0–8.0)

## 2014-05-04 LAB — WET PREP, GENITAL
Clue Cells Wet Prep HPF POC: NONE SEEN
Trich, Wet Prep: NONE SEEN
YEAST WET PREP: NONE SEEN

## 2014-05-04 LAB — FETAL FIBRONECTIN: Fetal Fibronectin: NEGATIVE

## 2014-05-04 NOTE — MAU Note (Signed)
Pt presents complaining of a possible DVT in her left leg. States it was painful this am but it has gotten better. Area behind left knee swollen and warm to touch. Denies vaginal bleeding or discharge. Reports good fetal movement.

## 2014-05-04 NOTE — MAU Provider Note (Signed)
History  Chief Complaint:  Possible DVT   Jody Cooper is a 29 y.o. G1P0 female at [redacted]w[redacted]d presenting w/ report of ? DVT behind Lt knee. Has varicose veins, but woke up this am and this area was a lot larger and tender and warm to touch. She is visiting here from [facility], plans to go home tomorrow. Has not been wearing compression hose. No problems during this pregnancy.    Reports active fetal movement, contractions: none, vaginal bleeding: none, membranes: intact. Denies uti s/s, abnormal/malodorous vag d/c or vulvovaginal itching/irritation.   Prenatal care at [facility].  Next visit in about 3wks. Pregnancy complicated by varicose veins.   Obstetrical History: OB History    Gravida Para Term Preterm AB TAB SAB Ectopic Multiple Living   1               Past Medical History: Past Medical History  Diagnosis Date  . Medical history non-contributory     Past Surgical History: Past Surgical History  Procedure Laterality Date  . Breast enhancement surgery      Social History: History   Social History  . Marital Status: Single    Spouse Name: N/A    Number of Children: N/A  . Years of Education: N/A   Social History Main Topics  . Smoking status: Never Smoker   . Smokeless tobacco: None  . Alcohol Use: No  . Drug Use: No  . Sexual Activity: Yes   Other Topics Concern  . None   Social History Narrative  . None    Allergies: Allergies  Allergen Reactions  . Ciprofloxacin Hives    Prescriptions prior to admission  Medication Sig Dispense Refill Last Dose  . Prenatal Vit-Fe Fumarate-FA (PRENATAL MULTIVITAMIN) TABS tablet Take 1 tablet by mouth daily.   Past Week at Unknown time    Review of Systems  Pertinent pos/neg as indicated in HPI  Physical Exam  Blood pressure 101/54, pulse 80, temperature 98 F (36.7 C), resp. rate 18, height  (1.727 m), weight 71.487 kg (157 lb 9.6 oz). General appearance: alert, cooperative and no distress Lungs: clear to  auscultation bilaterally, normal effort Heart: regular rate and rhythm Abdomen: gravid, soft, non-tender Extremities: No edema, many varicosities bilateral lower extremities. Large varicose veins behind Lt knee, no erythema, cords, streaking.   Spec exam: cx visually closed, moderate amount thick white nonodorous d/c SVE: closed/50/out of pelvis by Lynnea Maizes, SNM FFN, wet prep collected, has had recent neg gc/ct  Fetal monitoring: FHR: 150 bpm, variability: moderate,  Accelerations: Present,  decelerations:  Absent Uterine activity: irritability w/ occ uc's- pt not feeling any of it  MAU Course  Venous doppler of Lt lower extremity: No evidence of DVT, superficial thrombosis, or Baker's cyst UA  Labs:  Results for orders placed or performed during the hospital encounter of 05/04/14 (from the past 24 hour(s))  Urinalysis, Routine w reflex microscopic     Status: Abnormal   Collection Time: 05/04/14  5:10 PM  Result Value Ref Range   Color, Urine YELLOW YELLOW   APPearance CLEAR CLEAR   Specific Gravity, Urine 1.010 1.005 - 1.030   pH 7.0 5.0 - 8.0   Glucose, UA NEGATIVE NEGATIVE mg/dL   Hgb urine dipstick NEGATIVE NEGATIVE   Bilirubin Urine NEGATIVE NEGATIVE   Ketones, ur NEGATIVE NEGATIVE mg/dL   Protein, ur NEGATIVE NEGATIVE mg/dL   Urobilinogen, UA 0.2 0.0 - 1.0 mg/dL   Nitrite NEGATIVE NEGATIVE   Leukocytes, UA  MODERATE (A) NEGATIVE  Urine microscopic-add on     Status: Abnormal   Collection Time: 05/04/14  5:10 PM  Result Value Ref Range   Squamous Epithelial / LPF FEW (A) RARE   WBC, UA 3-6 <3 WBC/hpf   RBC / HPF 0-2 <3 RBC/hpf   Bacteria, UA FEW (A) RARE  Wet prep, genital     Status: Abnormal   Collection Time: 05/04/14  5:35 PM  Result Value Ref Range   Yeast Wet Prep HPF POC NONE SEEN NONE SEEN   Trich, Wet Prep NONE SEEN NONE SEEN   Clue Cells Wet Prep HPF POC NONE SEEN NONE SEEN   WBC, Wet Prep HPF POC FEW (A) NONE SEEN  Fetal fibronectin     Status: None    Collection Time: 05/04/14  5:35 PM  Result Value Ref Range   Fetal Fibronectin NEGATIVE NEGATIVE     Imaging:  CL: 3.1cm, breech, afi 10.9cm  Assessment and Plan  A:  [redacted]w[redacted]d SIUP  G1P0  Varicose veins of lower extremity   Reactive NST/Cat I  Preterm irritability w/ occ uc's, not perceived by pt, neg fFN and CL 3.1cm P:  D/C home  Reviewed ptl s/s, fkc, increase po fluids  Call provider on Monday to schedule earlier appt and to get rx for prescription-strength compression stockings   Marge Duncans CNM,WHNP-BC 1/2/20167:53 PM

## 2014-05-04 NOTE — MAU Note (Signed)
[employer] vascular tech called back. Will be over to see pt

## 2014-05-04 NOTE — Discharge Instructions (Signed)
Call your office or come back to [facility] if:  You begin to have strong, frequent contractions  Your water breaks.  Sometimes it is a big gush of fluid, sometimes it is just a trickle that keeps getting your panties wet or running down your legs  You have vaginal bleeding.  It is normal to have a small amount of spotting if your cervix was checked.   You don't feel your baby moving like normal.  If you don't, get you something to eat and drink and lay down and focus on feeling your baby move.  You should feel at least 10 movements in 2 hours.  If you don't, you should call the office or go to [facility].     Varicose Veins Varicose veins are veins that have become enlarged and twisted. CAUSES This condition is the result of valves in the veins not working properly. Valves in the veins help return blood from the leg to the heart. If these valves are damaged, blood flows backwards and backs up into the veins in the leg near the skin. This causes the veins to become larger. People who are on their feet a lot, who are pregnant, or who are overweight are more likely to develop varicose veins. SYMPTOMS   Bulging, twisted-appearing, bluish veins, most commonly found on the legs.  Leg pain or a feeling of heaviness. These symptoms may be worse at the end of the day.  Leg swelling.  Skin color changes. DIAGNOSIS  Varicose veins can usually be diagnosed with an exam of your legs by your caregiver. He or she may recommend an ultrasound of your leg veins. TREATMENT  Most varicose veins can be treated at home.However, other treatments are available for people who have persistent symptoms or who want to treat the cosmetic appearance of the varicose veins. These include:  Laser treatment of very small varicose veins.  Medicine that is shot (injected) into the vein. This medicine hardens the walls of the vein and closes off the vein. This treatment is called sclerotherapy. Afterwards,  you may need to wear clothing or bandages that apply pressure.  Surgery. HOME CARE INSTRUCTIONS   Do not stand or sit in one position for long periods of time. Do not sit with your legs crossed. Rest with your legs raised during the day.  Wear elastic stockings or support hose. Do not wear other tight, encircling garments around the legs, pelvis, or waist.  Walk as much as possible to increase blood flow.  Raise the foot of your bed at night with 2-inch blocks.  If you get a cut in the skin over the vein and the vein bleeds, lie down with your leg raised and press on it with a clean cloth until the bleeding stops. Then place a bandage (dressing) on the cut. See your caregiver if it continues to bleed or needs stitches. SEEK MEDICAL CARE IF:   The skin around your ankle starts to break down.  You have pain, redness, tenderness, or hard swelling developing in your leg over a vein.  You are uncomfortable due to leg pain. Document Released: 01/27/2005 Document Revised: 07/12/2011 Document Reviewed: 06/15/2010 [facility] Patient Information 2015 [place], [place]. This information is not intended to replace advice given to you by your health care provider. Make sure you discuss any questions you have with your health care provider.  Preterm Labor Information Preterm labor is when labor starts at less than 37 weeks of pregnancy. The normal length of  a pregnancy is 39 to 41 weeks. CAUSES Often, there is no identifiable underlying cause as to why a woman goes into preterm labor. One of the most common known causes of preterm labor is infection. Infections of the uterus, cervix, vagina, amniotic sac, bladder, kidney, or even the lungs (pneumonia) can cause labor to start. Other suspected causes of preterm labor include:   Urogenital infections, such as yeast infections and bacterial vaginosis.   Uterine abnormalities (uterine shape, uterine septum, fibroids, or bleeding from the placenta).   A  cervix that has been operated on (it may fail to stay closed).   Malformations in the fetus.   Multiple gestations (twins, triplets, and so on).   Breakage of the amniotic sac.  RISK FACTORS  Having a previous history of preterm labor.   Having premature rupture of membranes (PROM).   Having a placenta that covers the opening of the cervix (placenta previa).   Having a placenta that separates from the uterus (placental abruption).   Having a cervix that is too weak to hold the fetus in the uterus (incompetent cervix).   Having too much fluid in the amniotic sac (polyhydramnios).   Taking illegal drugs or smoking while pregnant.   Not gaining enough weight while pregnant.   Being younger than 103 and older than 29 years old.   Having a low socioeconomic status.   Being African American. SYMPTOMS Signs and symptoms of preterm labor include:   Menstrual-like cramps, abdominal pain, or back pain.  Uterine contractions that are regular, as frequent as six in an hour, regardless of their intensity (may be mild or painful).  Contractions that start on the top of the uterus and spread down to the lower abdomen and back.   A sense of increased pelvic pressure.   A watery or bloody mucus discharge that comes from the vagina.  TREATMENT Depending on the length of the pregnancy and other circumstances, your health care provider may suggest bed rest. If necessary, there are medicines that can be given to stop contractions and to mature the fetal lungs. If labor happens before 34 weeks of pregnancy, a prolonged hospital stay may be recommended. Treatment depends on the condition of both you and the fetus.  WHAT SHOULD YOU DO IF YOU THINK YOU ARE IN PRETERM LABOR? Call your health care provider right away. You will need to go to the hospital to get checked immediately. HOW CAN YOU PREVENT PRETERM LABOR IN FUTURE PREGNANCIES? You should:   Stop smoking if you  smoke.  Maintain healthy weight gain and avoid chemicals and drugs that are not necessary.  Be watchful for any type of infection.  Inform your health care provider if you have a known history of preterm labor. Document Released: 07/10/2003 Document Revised: 12/20/2012 Document Reviewed: 05/22/2012 [facility] Patient Information 2015 [place], [place]. This information is not intended to replace advice given to you by your health care provider. Make sure you discuss any questions you have with your health care provider.

## 2014-05-04 NOTE — Progress Notes (Addendum)
VASCULAR LAB PRELIMINARY  PRELIMINARY  PRELIMINARY  PRELIMINARY  Left lower extremity venous duplex completed.    Preliminary report:  Left:  No evidence of DVT, superficial thrombosis, or Baker's cyst. There is a short segment of non thrombosed dilated varicosity medial thigh just proximal to the knee.  Nichlos Kunzler, RVS 05/04/2014, 5:05 PM

## 2014-05-04 NOTE — MAU Note (Signed)
Vascular tech paged 

## 2014-05-05 DIAGNOSIS — Z3A30 30 weeks gestation of pregnancy: Secondary | ICD-10-CM | POA: Insufficient documentation

## 2014-05-05 DIAGNOSIS — O26873 Cervical shortening, third trimester: Secondary | ICD-10-CM | POA: Insufficient documentation

## 2014-05-05 DIAGNOSIS — O479 False labor, unspecified: Secondary | ICD-10-CM | POA: Insufficient documentation

## 2014-05-05 DIAGNOSIS — O4700 False labor before 37 completed weeks of gestation, unspecified trimester: Secondary | ICD-10-CM | POA: Insufficient documentation

## 2014-06-19 ENCOUNTER — Inpatient Hospital Stay
Admission: AD | Admit: 2014-06-19 | Discharge: 2014-06-24 | DRG: 765 | Disposition: A | Discharge status: Home / Self Care | Payer: Commercial Managed Care - POS | Source: Ambulatory Visit | Attending: Obstetrics & Gynecology | Admitting: Obstetrics & Gynecology

## 2014-06-19 ENCOUNTER — Inpatient Hospital Stay (HOSPITAL_BASED_OUTPATIENT_CLINIC_OR_DEPARTMENT_OTHER): Payer: Commercial Managed Care - POS | Admitting: Obstetrics & Gynecology

## 2014-06-19 ENCOUNTER — Encounter (HOSPITAL_BASED_OUTPATIENT_CLINIC_OR_DEPARTMENT_OTHER): Payer: Self-pay

## 2014-06-19 ENCOUNTER — Observation Stay (HOSPITAL_BASED_OUTPATIENT_CLINIC_OR_DEPARTMENT_OTHER): Payer: Commercial Managed Care - POS

## 2014-06-19 DIAGNOSIS — Z3A36 36 weeks gestation of pregnancy: Secondary | ICD-10-CM | POA: Diagnosis present

## 2014-06-19 DIAGNOSIS — O321XX0 Maternal care for breech presentation, not applicable or unspecified: Secondary | ICD-10-CM | POA: Diagnosis present

## 2014-06-19 DIAGNOSIS — O4100X0 Oligohydramnios, unspecified trimester, not applicable or unspecified: Secondary | ICD-10-CM | POA: Diagnosis present

## 2014-06-19 DIAGNOSIS — O4103X0 Oligohydramnios, third trimester, not applicable or unspecified: Principal | ICD-10-CM | POA: Diagnosis present

## 2014-06-19 MED ORDER — ACETAMINOPHEN 325 MG PO TABS
650.0000 mg | ORAL_TABLET | ORAL | Status: DC | PRN
Start: 2014-06-19 — End: 2014-06-24

## 2014-06-19 MED ORDER — ACETAMINOPHEN 650 MG RE SUPP
650.0000 mg | RECTAL | Status: DC | PRN
Start: 2014-06-19 — End: 2014-06-24
  Filled 2014-06-19 (×6): qty 1

## 2014-06-19 NOTE — Progress Notes (Signed)
Pt received triage into ldr 124.

## 2014-06-19 NOTE — H&P (Signed)
Subjective:   Julia Carlson is a 29 y.o. G1 P0 female with EDC 07/16/14 at 90 and 1/[redacted] weeks gestation who is being admitted for continuous monitoring due to severe oligohydramnios, breech who just ate full meal.  Her current obstetrical history is significant for breech presentation, oligo.  Patient reports no complaints-declines any LOF.   Fetal Movement: normal.    POB/GynHx :primig  PM/SHx: breast augmentation, heart murmur-w/u neg    Objective:     Vital signs in last 24 hours:  Temp:  [98 F (36.7 C)] 98 F (36.7 C)  Heart Rate:  [72] 72  Resp Rate:  [18] 18  BP: (108)/(68) 108/68 mmHg   FHTS: cat 1  Toco:  q17min      General:   alert, appears stated age and cooperative   Lungs:   clear to auscultation bilaterally   Heart:   regular rate and rhythm, S1, S2 normal, no murmur, click, rub or gallop   Abdomen:  soft, non-tender; bowel sounds normal; no masses,  no organomegaly   SVE:  deferred   Lab Review   O, Rh+, Rubella-immune, Hepatitis B surface antigen non-reactive, GBS unknown (pending)       Assessment/Plan:   36 and 1/[redacted] weeks gestation breech, severe oligohydramnios for admission to L&D for continuous monitoring overnight then scheduled for 1CS @1130am       Risks, benefits, alternatives and possible complications have been discussed in detail with the patient.  Pre-admission, admission, and post admission procedures and expectations were discussed in detail.  All questions answered, all appropriate consents will be signed at the [place] [id number]

## 2014-06-19 NOTE — Plan of Care (Signed)
RN at bedside. Pt up in bed eating. NAD. S/O at bedside for support. POC discussed, pt verbalizes understanding. Pt denies needs, concerns at this time. Will cont to monitor.

## 2014-06-20 ENCOUNTER — Observation Stay (HOSPITAL_BASED_OUTPATIENT_CLINIC_OR_DEPARTMENT_OTHER): Payer: Commercial Managed Care - POS | Admitting: Pain Medicine

## 2014-06-20 ENCOUNTER — Encounter (HOSPITAL_BASED_OUTPATIENT_CLINIC_OR_DEPARTMENT_OTHER)
Admission: AD | Disposition: A | Discharge status: Home / Self Care | Payer: Self-pay | Source: Ambulatory Visit | Attending: Obstetrics & Gynecology

## 2014-06-20 ENCOUNTER — Ambulatory Visit: Payer: Self-pay

## 2014-06-20 ENCOUNTER — Encounter (HOSPITAL_BASED_OUTPATIENT_CLINIC_OR_DEPARTMENT_OTHER): Payer: Self-pay

## 2014-06-20 DIAGNOSIS — O4100X0 Oligohydramnios, unspecified trimester, not applicable or unspecified: Secondary | ICD-10-CM | POA: Diagnosis present

## 2014-06-20 LAB — CBC AND DIFFERENTIAL
Basophils Absolute Automated: 0.02 10*3/uL (ref 0.00–0.20)
Basophils Automated: 0 %
Eosinophils Absolute Automated: 0.06 10*3/uL (ref 0.00–0.70)
Eosinophils Automated: 1 %
Hematocrit: 36.8 % — ABNORMAL LOW (ref 37.0–47.0)
Hgb: 11.7 g/dL — ABNORMAL LOW (ref 12.0–16.0)
Immature Granulocytes Absolute: 0.02 10*3/uL
Immature Granulocytes: 0 %
Lymphocytes Absolute Automated: 1.84 10*3/uL (ref 0.50–4.40)
Lymphocytes Automated: 28 %
MCH: 27.7 pg — ABNORMAL LOW (ref 28.0–32.0)
MCHC: 31.8 g/dL — ABNORMAL LOW (ref 32.0–36.0)
MCV: 87 fL (ref 80.0–100.0)
MPV: 10.5 fL (ref 9.4–12.3)
Monocytes Absolute Automated: 0.76 10*3/uL (ref 0.00–1.20)
Monocytes: 11 %
Neutrophils Absolute: 3.99 10*3/uL (ref 1.80–8.10)
Neutrophils: 60 %
Nucleated RBC: 0 /100 WBC (ref 0–1)
Platelets: 231 10*3/uL (ref 140–400)
RBC: 4.23 10*6/uL (ref 4.20–5.40)
RDW: 15 % (ref 12–15)
WBC: 6.69 10*3/uL (ref 3.50–10.80)

## 2014-06-20 LAB — TYPE AND SCREEN
AB Screen Gel: NEGATIVE
ABO Rh: O POS

## 2014-06-20 SURGERY — Surgical Case
Anesthesia: Regional | Site: Abdomen | Wound class: Clean Contaminated

## 2014-06-20 MED ORDER — FAMOTIDINE 20 MG/2ML IV SOLN
INTRAVENOUS | Status: AC
Start: 2014-06-20 — End: ?
  Filled 2014-06-20: qty 2

## 2014-06-20 MED ORDER — PRENATAL AD PO TABS
1.0000 | ORAL_TABLET | Freq: Every day | ORAL | Status: DC
Start: 2014-06-20 — End: 2014-06-24
  Administered 2014-06-21 – 2014-06-24 (×4): 1 via ORAL
  Filled 2014-06-20 (×4): qty 1

## 2014-06-20 MED ORDER — LACTATED RINGERS IV SOLN
INTRAVENOUS | Status: DC
Start: 2014-06-20 — End: 2014-06-24

## 2014-06-20 MED ORDER — OXYCODONE-ACETAMINOPHEN 5-325 MG PO TABS
ORAL_TABLET | ORAL | Status: AC
Start: 2014-06-20 — End: ?
  Filled 2014-06-20: qty 1

## 2014-06-20 MED ORDER — DOCUSATE SODIUM 100 MG PO CAPS
200.0000 mg | ORAL_CAPSULE | Freq: Two times a day (BID) | ORAL | Status: DC | PRN
Start: 2014-06-20 — End: 2014-06-24
  Administered 2014-06-21 – 2014-06-24 (×7): 200 mg via ORAL
  Filled 2014-06-20 (×7): qty 2

## 2014-06-20 MED ORDER — OXYTOCIN-LACTATED RINGERS 30 UNIT/500ML IV SOLN
7.5000 [IU]/h | INTRAVENOUS | Status: AC
Start: 2014-06-20 — End: 2014-06-21
  Administered 2014-06-20: 7.5 [IU]/h via INTRAVENOUS

## 2014-06-20 MED ORDER — OXYCODONE-ACETAMINOPHEN 5-325 MG PO TABS
1.0000 | ORAL_TABLET | ORAL | Status: DC | PRN
Start: 2014-06-20 — End: 2014-06-24
  Administered 2014-06-20 – 2014-06-23 (×14): 1 via ORAL
  Filled 2014-06-20 (×14): qty 1

## 2014-06-20 MED ORDER — PHENYLEPHRINE 100 MCG/ML IV BOLUS (ANESTHESIA)
PREFILLED_SYRINGE | INTRAVENOUS | Status: AC
Start: 2014-06-20 — End: ?
  Filled 2014-06-20: qty 5

## 2014-06-20 MED ORDER — NALBUPHINE HCL 10 MG/ML IJ SOLN
5.0000 mg | INTRAMUSCULAR | Status: DC | PRN
Start: 2014-06-20 — End: 2014-06-24

## 2014-06-20 MED ORDER — NALBUPHINE HCL 10 MG/ML IJ SOLN
5.0000 mg | INTRAMUSCULAR | Status: DC | PRN
Start: 2014-06-20 — End: 2014-06-24
  Administered 2014-06-20: 5 mg via SUBCUTANEOUS

## 2014-06-20 MED ORDER — OXYTOCIN-LACTATED RINGERS 30 UNIT/500ML IV SOLN
INTRAVENOUS | Status: AC
Start: 2014-06-20 — End: ?
  Filled 2014-06-20: qty 500

## 2014-06-20 MED ORDER — MORPHINE SULFATE (PF) 1 MG/ML IJ SOLN
INTRAMUSCULAR | Status: DC | PRN
Start: 2014-06-20 — End: 2014-06-20
  Administered 2014-06-20: 2.1 mL via INTRASPINAL

## 2014-06-20 MED ORDER — ONDANSETRON HCL 4 MG/2ML IJ SOLN
INTRAMUSCULAR | Status: AC
Start: 2014-06-20 — End: ?
  Filled 2014-06-20: qty 2

## 2014-06-20 MED ORDER — NALBUPHINE HCL 10 MG/ML IJ SOLN
INTRAMUSCULAR | Status: AC
Start: 2014-06-20 — End: ?
  Filled 2014-06-20: qty 1

## 2014-06-20 MED ORDER — TETANUS-DIPHTH-ACELL PERTUSSIS 5-2.5-18.5 LF-MCG/0.5 IM SUSP
0.5000 mL | INTRAMUSCULAR | Status: DC | PRN
Start: 2014-06-20 — End: 2014-06-24

## 2014-06-20 MED ORDER — LANOLIN EX OINT
TOPICAL_OINTMENT | CUTANEOUS | Status: DC | PRN
Start: 2014-06-20 — End: 2014-06-24
  Filled 2014-06-20: qty 7

## 2014-06-20 MED ORDER — MEPERIDINE HCL 25 MG/ML IJ SOLN
12.5000 mg | INTRAMUSCULAR | Status: DC | PRN
Start: 2014-06-20 — End: 2014-06-24

## 2014-06-20 MED ORDER — HYDROMORPHONE HCL 1 MG/ML IJ SOLN
0.5000 mg | INTRAMUSCULAR | Status: DC | PRN
Start: 2014-06-20 — End: 2014-06-24

## 2014-06-20 MED ORDER — MEASLES, MUMPS & RUBELLA VAC SC INJ
0.5000 mL | INJECTION | SUBCUTANEOUS | Status: DC | PRN
Start: 2014-06-20 — End: 2014-06-24

## 2014-06-20 MED ORDER — CEFAZOLIN SODIUM-DEXTROSE 2-3 GM-% IV SOLR
2.0000 g | Freq: Once | INTRAVENOUS | Status: AC
Start: 2014-06-20 — End: 2014-06-20
  Administered 2014-06-20: 2 g via INTRAVENOUS
  Filled 2014-06-20: qty 50

## 2014-06-20 MED ORDER — ONDANSETRON HCL 4 MG/2ML IJ SOLN
4.0000 mg | Freq: Once | INTRAMUSCULAR | Status: AC | PRN
Start: 2014-06-20 — End: 2014-06-20
  Administered 2014-06-20: 4 mg via INTRAVENOUS
  Filled 2014-06-20: qty 2

## 2014-06-20 MED ORDER — SODIUM CHLORIDE 0.9 % IJ SOLN
3.0000 mL | Freq: Three times a day (TID) | INTRAMUSCULAR | Status: DC
Start: 2014-06-22 — End: 2014-06-24

## 2014-06-20 MED ORDER — BISACODYL 10 MG RE SUPP
10.0000 mg | Freq: Every day | RECTAL | Status: DC | PRN
Start: 2014-06-20 — End: 2014-06-24

## 2014-06-20 MED ORDER — PROMETHAZINE HCL 25 MG/ML IJ SOLN
6.2500 mg | Freq: Once | INTRAMUSCULAR | Status: DC | PRN
Start: 2014-06-20 — End: 2014-06-24

## 2014-06-20 MED ORDER — FAMOTIDINE 10 MG/ML IV SOLN (WRAP)
INTRAVENOUS | Status: DC | PRN
Start: 2014-06-20 — End: 2014-06-20
  Administered 2014-06-20: 20 mg via INTRAVENOUS

## 2014-06-20 MED ORDER — OXYTOCIN 10 UNIT/ML IJ SOLN
INTRAMUSCULAR | Status: AC
Start: 2014-06-20 — End: ?
  Filled 2014-06-20: qty 2

## 2014-06-20 MED ORDER — IBUPROFEN 600 MG PO TABS
600.0000 mg | ORAL_TABLET | Freq: Four times a day (QID) | ORAL | Status: DC | PRN
Start: 2014-06-20 — End: 2014-06-24
  Administered 2014-06-20 – 2014-06-21 (×3): 600 mg via ORAL
  Filled 2014-06-20 (×6): qty 1

## 2014-06-20 MED ORDER — FENTANYL CITRATE 0.05 MG/ML IJ SOLN
25.0000 ug | INTRAMUSCULAR | Status: DC | PRN
Start: 2014-06-20 — End: 2014-06-24

## 2014-06-20 MED ORDER — LACTATED RINGERS IV BOLUS
1000.0000 mL | Freq: Once | INTRAVENOUS | Status: AC
Start: 2014-06-20 — End: 2014-06-20

## 2014-06-20 MED ORDER — OXYTOCIN 10 UNIT/ML IJ SOLN
INTRAMUSCULAR | Status: DC | PRN
Start: 2014-06-20 — End: 2014-06-20
  Administered 2014-06-20 (×2): 20 [IU] via INTRAVENOUS

## 2014-06-20 MED ORDER — METOCLOPRAMIDE HCL 5 MG/ML IJ SOLN
10.0000 mg | Freq: Once | INTRAMUSCULAR | Status: DC | PRN
Start: 2014-06-20 — End: 2014-06-24

## 2014-06-20 MED ORDER — OXYCODONE-ACETAMINOPHEN 5-325 MG PO TABS
2.0000 | ORAL_TABLET | ORAL | Status: DC | PRN
Start: 2014-06-20 — End: 2014-06-24

## 2014-06-20 MED ORDER — IBUPROFEN 600 MG PO TABS
600.0000 mg | ORAL_TABLET | Freq: Four times a day (QID) | ORAL | Status: DC
Start: 2014-06-21 — End: 2014-06-24
  Administered 2014-06-21 – 2014-06-24 (×12): 600 mg via ORAL
  Filled 2014-06-20 (×10): qty 1

## 2014-06-20 MED ORDER — EPHEDRINE SULFATE 50 MG/ML IJ SOLN
INTRAMUSCULAR | Status: AC
Start: 2014-06-20 — End: ?
  Filled 2014-06-20: qty 1

## 2014-06-20 MED ORDER — LACTATED RINGERS IV SOLN
125.0000 mL/h | INTRAVENOUS | Status: AC
Start: 2014-06-21 — End: 2014-06-22

## 2014-06-20 MED ORDER — EPHEDRINE SULFATE 50 MG/ML IJ SOLN
INTRAMUSCULAR | Status: DC | PRN
Start: 2014-06-20 — End: 2014-06-20
  Administered 2014-06-20: 09:00:00 50 mg via INTRAMUSCULAR

## 2014-06-20 MED ORDER — METHYLERGONOVINE MALEATE 0.2 MG PO TABS
0.2000 mg | ORAL_TABLET | Freq: Four times a day (QID) | ORAL | Status: DC | PRN
Start: 2014-06-20 — End: 2014-06-24

## 2014-06-20 MED ORDER — IBUPROFEN 600 MG PO TABS
600.0000 mg | ORAL_TABLET | Freq: Four times a day (QID) | ORAL | Status: DC | PRN
Start: 2014-06-20 — End: 2014-06-20

## 2014-06-20 MED ORDER — PHENYLEPHRINE 100 MCG/ML IV BOLUS (ANESTHESIA)
PREFILLED_SYRINGE | INTRAVENOUS | Status: DC | PRN
Start: 2014-06-20 — End: 2014-06-20
  Administered 2014-06-20 (×5): 100 ug via INTRAVENOUS

## 2014-06-20 MED ORDER — MISOPROSTOL 200 MCG PO TABS
ORAL_TABLET | ORAL | Status: AC
Start: 2014-06-20 — End: 2014-06-20
  Filled 2014-06-20: qty 4

## 2014-06-20 MED ORDER — METHYLERGONOVINE MALEATE 0.2 MG/ML IJ SOLN
200.0000 ug | Freq: Four times a day (QID) | INTRAMUSCULAR | Status: DC | PRN
Start: 2014-06-20 — End: 2014-06-24

## 2014-06-20 MED ORDER — LACTATED RINGERS IV SOLN
INTRAVENOUS | Status: DC | PRN
Start: 2014-06-20 — End: 2014-06-20

## 2014-06-20 MED ORDER — SIMETHICONE 80 MG PO CHEW
160.0000 mg | CHEWABLE_TABLET | Freq: Three times a day (TID) | ORAL | Status: DC | PRN
Start: 2014-06-20 — End: 2014-06-24
  Administered 2014-06-20: 160 mg via ORAL

## 2014-06-20 MED ORDER — NALOXONE HCL 0.4 MG/ML IJ SOLN
0.1000 mg | INTRAMUSCULAR | Status: DC | PRN
Start: 2014-06-20 — End: 2014-06-24

## 2014-06-20 MED ORDER — ONDANSETRON HCL 4 MG/2ML IJ SOLN
4.0000 mg | Freq: Once | INTRAMUSCULAR | Status: DC | PRN
Start: 2014-06-20 — End: 2014-06-20

## 2014-06-20 MED ORDER — DIPHENHYDRAMINE HCL 50 MG/ML IJ SOLN
12.5000 mg | INTRAMUSCULAR | Status: DC | PRN
Start: 2014-06-20 — End: 2014-06-24

## 2014-06-20 MED ORDER — NALOXONE HCL 0.4 MG/ML IJ SOLN
0.1000 mg | INTRAMUSCULAR | Status: DC | PRN
Start: 2014-06-20 — End: 2014-06-20

## 2014-06-20 MED ORDER — MORPHINE SULFATE (PF) 1 MG/ML IJ SOLN
INTRAMUSCULAR | Status: AC
Start: 2014-06-20 — End: ?
  Filled 2014-06-20: qty 10

## 2014-06-20 MED ORDER — OXYCODONE-ACETAMINOPHEN 5-325 MG PO TABS
1.0000 | ORAL_TABLET | ORAL | Status: DC | PRN
Start: 2014-06-20 — End: 2014-06-20

## 2014-06-20 MED ORDER — ONDANSETRON HCL 4 MG/2ML IJ SOLN
INTRAMUSCULAR | Status: DC | PRN
Start: 2014-06-20 — End: 2014-06-20
  Administered 2014-06-20: 4 mg via INTRAVENOUS

## 2014-06-20 MED ORDER — MISOPROSTOL 100 MCG PO TABS
ORAL_TABLET | ORAL | Status: DC | PRN
Start: 2014-06-20 — End: 2014-06-20
  Administered 2014-06-20: 800 ug via INTRAUTERINE

## 2014-06-20 SURGICAL SUPPLY — 22 items
BANDAGE STERI-STRIP 0.5X4IN (Dressing) ×1
CLEANER ELECTROSURGICAL TIP PENCIL (Procedure Accessories) ×1
CLEANER ELECTROSURGICAL TIP PENCIL HANDSWITCH LECTROBRASIVE (Procedure Accessories) ×1 IMPLANT
DRESSING TELFA 3X8IN STERILE (Dressing) ×2 IMPLANT
GLOVE SURG BIOGEL SZ7.5 (Glove) ×2 IMPLANT
MARKER SKIN (Positioning Supplies) ×2 IMPLANT
PAD ELECTROSRG GRND REM W CRD (Procedure Accessories) ×2 IMPLANT
PAD VALLEYLAB SCRATCH 5.08CM (Procedure Accessories) ×1
PENCIL ELECTRO PUSH BUTTON (Cautery) ×2 IMPLANT
SLEEVE CMPR MED KN LGTH KDL SCD 21- IN (Sleeve) ×1
SLEEVE COMPRESSION MEDIUM KNEE LENGTH KENDALL SEQUENTIAL OD21- IN (Sleeve) ×1 IMPLANT
SLEEVE SEQ COMP KNEE REGULAR (Sleeve) ×1
STAPLER SKIN L3.9 MM X W6.9 MM WIDE 35 (Skin Closure)
STAPLER SKIN L3.9 MM X W6.9 MM WIDE 35 COUNT FIX HEAD RATCHET (Skin Closure) IMPLANT
STAPLER SKIN PROXIMATE WIDE (Skin Closure)
STRIP SKIN CLOSURE L4 IN X W1/2 IN (Dressing) ×1
STRIP SKIN CLOSURE L4 IN X W1/2 IN REINFORCE STERI-STRIP POLYESTER (Dressing) ×1 IMPLANT
SUT MONOCRYL O CTX (Suture) ×4 IMPLANT
SUT PDS II 0 CTX MONO 60 IN (Suture) ×2 IMPLANT
SUTURE MONOCRYL 3-0 KS 27IN (Suture) IMPLANT
SUTURE PLAIN 2-0 CT1 27IN (Suture) ×2 IMPLANT
SYRINGE BULB 60CC LATEX FREE (Syringes, Needles) ×2 IMPLANT

## 2014-06-20 NOTE — Plan of Care (Signed)
Pt questions answered at this time. Pt in agreement with plan of care and has no further questions at this time. Will continue to monitor pt.

## 2014-06-20 NOTE — Anesthesia Postprocedure Evaluation (Signed)
Anesthesia Post Evaluation    Patient: Julia Carlson    Procedure(s):  CESAREAN SECTION    Anesthesia type: spinal    Last Vitals:   Filed Vitals:    06/20/14 1120   BP: 137/68   Pulse: 80   Temp: 36.2 C (97.2 F)   Resp: 16   SpO2: 99%       Patient Location: Phase I PACU      Post Pain: Patient not complaining of pain, continue current therapy    Mental Status: awake and alert    Respiratory Function: tolerating room air    Cardiovascular: stable    Nausea/Vomiting: patient not complaining of nausea or vomiting    Hydration Status: adequate    Post Assessment: no apparent anesthetic complications and patient was able to participate in post-op evaluation but recovery from regional anesthesia has not occurred and was not expected to occur within this time frame.          Anesthesia Qualified Clinical Data Registry    1.  CVC insertion : NO                                               2.  General/neuraxial anesthesia > or = 60 minutes (excluding CABG) : YES              > Use of intraoperative active warming : YES              > Temperature > or = 36 degrees Centigrade (96.8 degrees Farenheit) during time span from 30 minutes before up to 15 minutes after anesthesia end time : YES      3.  Age > or = 18, with IV access, with surgical procedure for which antibiotic prophylaxis indicated, and not on chronic antibiotics : YES              > Prophylactic antibiotics within 1 hour of incision (or fluroroquinolone/vancomycin within 2 hours of incision) : YES    4.  Ordering or administration of drug inconsistent with intended drug, dose, delivery or timing : NO      5.  Dental injury with administration of anesthesia : NO      6.  Elective airway procedure including but not limited to: tracheostomy, fiberoptic bronchoscopy, rigid bronchoscopy; jet ventilation; or elective use of a device to facilitate airway management such as a Glidescope : NO                > Unanticipated difficult intubation post pre-evaluation : NO       7.  Aspiration of gastric contents : NO                    8.  Procedure requiring electrocautery/laser : NO                    9.  Cardiac arrest in OR or PACU : NO                    10.  Unplanned hospital admission for initially intended outpatient anesthesia service : NO      11.  Unplanned ICU admission related to anesthesia occurring within 24 hours of induction or start of MAC : NO      12.  Cancellation of procedure after care already started  by anesthesia care team : NO      13.  Transfer from OR or PACU upon case conclusion : NO                    14.  Transfer from OR or ICU upon case conclusion : NO                    15. Post-operative nausea/vomiting risk protocol. Patient > or = 18 with care initiated by anesthesia team that has a risk factor screen for post-op nausea/vomiting (Includes female, hx PONV, or motion sickness, non-smoker, intended opioid administration for post-op analgesia.) : YES    16.  Anaphylaxis secondary to anesthesia : NO      17.  Suspected transfusion reaction in association with blood-bank confirmed product incompatibility: NO        Marland Mcalpine, 06/20/2014 12:31 PM

## 2014-06-20 NOTE — Op Note (Signed)
Procedure Date: 06/20/2014     Patient Type: I     SURGEON: Patriciaann Clan MD  ASSISTANT:       PREOPERATIVE DIAGNOSES:  1.  Intrauterine pregnancy at 36 weeks.  2.  Breech presentation.  3.  Oligohydramnios.     POSTOPERATIVE DIAGNOSES:  1.  Intrauterine pregnancy at 36 weeks.  2.  Breech presentation.  3.  Oligohydramnios.     TITLE OF PROCEDURE:  Primary low transverse cesarean section.     ANESTHESIA:  Spinal.     ESTIMATED BLOOD LOSS:  Approximately 600 mL.     FINDINGS:  A 6-pound 14-ounce female, Apgars of 9 and 9, complete breech presentation.     PATHOLOGY:  Placenta.     DRAINS:  Foley catheter.     REPLACEMENT:  Approximately 2800 mL of crystalloid.     URINE OUTPUT:  Approximately 200 mL.     INDICATIONS FOR PROCEDURE:  The patient is a 29 year old primigravida who was noted to be breech on  ultrasound yesterday.  Also, at time of ultrasound, it was noted that the  patient had significant oligohydramnios with an AFI of 3.2.  After risks,  benefits, and alternatives were discussed with the patient, she opted for  continuous monitoring secondary to having eaten a large meal and elective  cesarean section in the morning.     DESCRIPTION OF PROCEDURE:  The patient was taken to the operating room, placed supine on the table,  and after adequate spinal anesthesia was achieved, the abdomen was  sterilely prepped and draped and a Foley was inserted under sterile  conditions.  A Pfannenstiel skin incision was made.  This was carried down  to the level of the fascia.  The fascia was incised on either side of the  midline.  This incision was extended laterally using sharp dissection.  The  fascia was then sharply and bluntly dissected off the underlying abdominal  musculature.  The abdominal musculature midline was identified and  separated using both sharp and blunt technique.  The peritoneum was entered  using blunt technique.  This incision was extended laterally using blunt  technique.  Bladder blade was placed,  uterovesical peritoneum was  identified, and the bladder flap was developed using both sharp and blunt  technique.  A low transverse uterine incision was made.  This was extended  laterally using blunt technique.  Artificial rupture of membranes revealed  clear fluid.  Infant was complete breech, which delivered easily on  maternal abdomen where cord was clamped and cut, and the infant was handed  to the awaiting pediatricians, found to be a 6-pound 14-ounce female with  Apgars of 8 and 9.  After cord blood was obtained, the placenta was  manually extracted from the uterus grossly intact.  There was a 3-vessel  cord and no gross abnormalities.  The placenta was sent off for pathologic  evaluation.  The uterus was exteriorized.  Bladder blade was placed.  The  uterus was closed from one angle to the other using a 0 Monocryl in a  running mattress suture.  A second imbricating layer was placed using the  same suture in a running Lembert fashion.  Cytotec 800 was placed within  the uterine cavity before the final stitch was placed.  Adequate hemostasis  was noted.  Posterior cul-de-sac was irrigated, clots removed, and  hemostasis was noted.  The uterus was placed back through the abdominal  incision where the pericolic gutters were copiously irrigated,  clots  removed, and again hemostasis was noted.  The muscle was closed using 2-0  plain in a running fashion.  The fascia was closed using a double-stranded  0 PDS starting at one angle of the incision, brought to the other side, and  tied to itself.  Plain 3-0 was put in the subcutaneous to reapproximate.   The skin was closed using 4-0 Dexon in a running subcuticular stitch.   Sterile dressing was placed over the incision before the operative field  was taken down.  Sponge, needle, and instrument counts were correct x2.           D:  06/20/2014 10:16 AM by Dr. Onalee Hua B. Allyson Sabal, MD [phone])  T:  06/20/2014 13:39 PM by       Everlean Cherry: [id number]) (Doc ID: [id number])

## 2014-06-20 NOTE — Plan of Care (Signed)
Problem: Pain/Discomfort  Goal: Pain/discomfort is manageable.  Outcome: Progressing    Problem: Cesarean and Vaginal Delivery-Postpartum Care  Goal: Incision/Perineum will be clean, dry, and intact and without discharge or hematoma.  Outcome: Progressing    Problem: Breast Care  Goal: Infant receives adequate nutrition  Outcome: Progressing  Goal: Breasts are soft with nipple integrity intact-vaginal delivery-recovery and post partum.  Outcome: Progressing

## 2014-06-20 NOTE — Transfer of Care (Addendum)
Anesthesia Transfer of Care Note    Patient: Julia Carlson    Procedures performed: Procedure(s):  CESAREAN SECTION    Anesthesia type: Spinal    Patient location:PACU    Last vitals:   Filed Vitals:    06/20/14 0809   BP: 102/57   Pulse: 81   Temp:    Resp: 18       Post pain: Patient not complaining of pain, continue current therapy      Mental Status:awake and alert     Respiratory Function: tolerating room air    Cardiovascular: stable    Nausea/Vomiting: patient not complaining of nausea or vomiting    Hydration Status: adequate    Post assessment: no apparent anesthetic complications

## 2014-06-20 NOTE — Anesthesia Procedure Notes (Signed)
Spinal    Patient location during procedure: OR  Reason for block: Primary Anesthesia In the OR  Block at Surgeon's request: Yes        Staffing  Anesthesiologist: DENNEY, ROGER A  Resident/CRNA: Astria Jordahl R  Performed by: resident/CRNA   Preanesthetic Checklist Completed: patient identified, surgical consent, pre-op evaluation, timeout performed, risks and benefits discussed, monitors and equipment checked, anesthesia consent given and correct site      Spinal  Patient monitoring: pulse oximetry and NIBP  Premedication: No    Patient position: sitting  Sterile Technique: Betadine, sterile drape, mask  and sterile gloves  Skin Local: lidocaine 1%    Interspace: L3-4    Approach: midline  Number of attempts: 1    Needle type: Whitacre tip     Needle gauge: 25              Paresthesia Pain: no        CSF Return: Yes  Blood Return: No          Assessment   Sensory level: T4  Block Outcome: patient tolerated procedure well, no complications and successful block

## 2014-06-20 NOTE — Anesthesia Preprocedure Evaluation (Addendum)
Anesthesia Evaluation    AIRWAY    Mallampati: III    TM distance: >3 FB  Neck ROM: full  Mouth Opening:full   CARDIOVASCULAR    cardiovascular exam normal       DENTAL    no notable dental hx     PULMONARY    pulmonary exam normal     OTHER FINDINGS                      Anesthesia Plan    ASA 2     spinal               (Healthy 29 yo G1P0 with no significant PMHx for c/s in setting of oligohydramnios and breech presentation.  Patient denies cardiac, pulm, hepatic or renal disease.  No bleeding/clotting issues.  Labs reviewed.  SAB for c/s; informed consent obtained.)              Post op pain management: intrathecal and PO analgesics          pertinent labs reviewed

## 2014-06-20 NOTE — Op Note (Signed)
OB BRIEF OP NOTE    Date Time: 06/20/2014 10:06 AM  Patient Name:   Julia Carlson    Date of Operation:   06/20/2014    Preoperative Diagnosis:   Pre-Op Diagnosis Codes:     * Oligohydramnios, unspecified trimester, not applicable or unspecified fetus [O41.00X0]     * Breech presentation, not applicable or unspecified fetus [O32.1XX0]    Postoperative Diagnosis:   Same    Providers Performing:   Surgeon(s):  Patriciaann Clan, MD        Operative Procedure:   primary cesarean section, low transverse incision    Findings:   Position Breech       Baby: Liveborn Female ; Apgar 1 minute: 9  Apgar 5 minute: 9 ; Birth weight: 6 lb 14.1 oz (3120 g)     Placenta:wnl  Adnexa: wnl  Three vessel cord: Yes      Additional Procedures:   none    Anesthesia:   Spinal         Estimated Blood Loss:    600 cc    Blood Replacement   2800 cc  200cc UOP    Drains:     foley    Specimens:   placenta    Complications:   none    Condition:   stable            Signed by: Patriciaann Clan, MD                                                                              [facility]

## 2014-06-20 NOTE — Progress Notes (Signed)
RBA discussed / ?s answered  OLIGO /36 wks Salomon Fick  To  1LTCS

## 2014-06-21 ENCOUNTER — Encounter (HOSPITAL_BASED_OUTPATIENT_CLINIC_OR_DEPARTMENT_OTHER): Payer: Self-pay | Admitting: Obstetrics and Gynecology

## 2014-06-21 LAB — CBC AND DIFFERENTIAL
Basophils Absolute Automated: 0.02 10*3/uL (ref 0.00–0.20)
Basophils Automated: 0 %
Eosinophils Absolute Automated: 0.07 10*3/uL (ref 0.00–0.70)
Eosinophils Automated: 1 %
Hematocrit: 30.8 % — ABNORMAL LOW (ref 37.0–47.0)
Hgb: 9.9 g/dL — ABNORMAL LOW (ref 12.0–16.0)
Immature Granulocytes Absolute: 0.03 10*3/uL
Immature Granulocytes: 0 %
Lymphocytes Absolute Automated: 1.73 10*3/uL (ref 0.50–4.40)
Lymphocytes Automated: 14 %
MCH: 27.9 pg — ABNORMAL LOW (ref 28.0–32.0)
MCHC: 32.1 g/dL (ref 32.0–36.0)
MCV: 86.8 fL (ref 80.0–100.0)
MPV: 10.4 fL (ref 9.4–12.3)
Monocytes Absolute Automated: 0.97 10*3/uL (ref 0.00–1.20)
Monocytes: 8 %
Neutrophils Absolute: 9.76 10*3/uL — ABNORMAL HIGH (ref 1.80–8.10)
Neutrophils: 78 %
Nucleated RBC: 0 /100 WBC (ref 0–1)
Platelets: 176 10*3/uL (ref 140–400)
RBC: 3.55 10*6/uL — ABNORMAL LOW (ref 4.20–5.40)
RDW: 14 % (ref 12–15)
WBC: 12.58 10*3/uL — ABNORMAL HIGH (ref 3.50–10.80)

## 2014-06-21 LAB — LAB USE ONLY - HISTORICAL SURGICAL PATHOLOGY

## 2014-06-21 NOTE — Progress Notes (Addendum)
I was called that patient's wound is open.   Patient comfortable in her bed, reported noticing the edges of wound are open in the morning, she denied any bleeding or discharge from wound. Afebrile and stable  Filed Vitals:    06/21/14 1913   BP: 114/70   Pulse: 91   Temp: 98 F (36.7 C)   Resp: 16   SpO2: 100%     On exam; in middle of wound, there was an area with length of ~ 4 cm that edges were ~ 0.5 cm apart from each other, rest of wound appeared closed and normal. steri strips placed with tension to hold the edges together.   A/P  29 yo, s/p primary LTCS, POD # 1 c/b skin edge separation on wound   - steri strips placed   - reevaluate in 24 hrs     Rostami, R2

## 2014-06-21 NOTE — Lactation Note (Addendum)
G   1P   1  C/S delivery on 06/20/14, 924AM        GA  36+2  Birth weight     6-14  10% weight loss = 6-3  Previous breast feeding history?  no  Significant maternal history?     Initial lactation consult.    C/o baby sleepy easily at the breast.    Breastfeeding basics reviewed and proper latching & positioning.  Discussed the late preterm baby and feeding behavior.    Assist the baby to latch deeper with wide open mouth by [facility] but unable to sustain suckling due to sleepiness.  Double and single ele pump instruction given and assist the pumping session.    Encouraged to do skin to skin and breast feed with all cues (at least 8-12 times per 24 hours).    Suggest to pump the breast after BF q2-3 hrs for 15-56mins to establish the milk supply and give EBM by paced bottle tech.  Baby formula supplemented per Ped's recommendation b/c not latching.   Suggest to monitor the output and wt check for adequate intake.  Refer to mother and baby booklet for further breastfeeding information.  Contact number given - Patient to call for questions or assistance as needed.   Follow up in AM.

## 2014-06-21 NOTE — Anesthesia Postprocedure Evaluation (Signed)
Anesthesia Post Evaluation    Patient: Julia Carlson    Procedure(s):  CESAREAN SECTION    Anesthesia type: spinal    Last Vitals:   Filed Vitals:    06/21/14 0956   BP: 87/48   Pulse: 78   Temp: 36.8 C (98.3 F)   Resp: 16   SpO2: 97%       Patient Location: Med Surgical Floor      Post Pain: Patient not complaining of pain, continue current therapy    Mental Status: awake    Respiratory Function: tolerating room air    Cardiovascular: stable    Nausea/Vomiting: patient not complaining of nausea or vomiting    Hydration Status: adequate    Post Assessment: no apparent anesthetic complications          Anesthesia Qualified Clinical Data Registry    1.  CVC insertion : NO                                               2.  General/neuraxial anesthesia > or = 60 minutes (excluding CABG) : NO                                3.  Age > or = 18, with IV access, with surgical procedure for which antibiotic prophylaxis indicated, and not on chronic antibiotics : YES              > Prophylactic antibiotics within 1 hour of incision (or fluroroquinolone/vancomycin within 2 hours of incision) : YES    4.  Ordering or administration of drug inconsistent with intended drug, dose, delivery or timing : NO      5.  Dental injury with administration of anesthesia : NO      6.  Elective airway procedure including but not limited to: tracheostomy, fiberoptic bronchoscopy, rigid bronchoscopy; jet ventilation; or elective use of a device to facilitate airway management such as a Glidescope : NO                > Unanticipated difficult intubation post pre-evaluation : NO      7.  Aspiration of gastric contents : NO                    8.  Procedure requiring electrocautery/laser : NO                    9.  Cardiac arrest in OR or PACU : NO                    10.  Unplanned hospital admission for initially intended outpatient anesthesia service : NO      11.  Unplanned ICU admission related to anesthesia occurring within 24 hours of  induction or start of MAC : NO      12.  Cancellation of procedure after care already started by anesthesia care team : NO      13.  Transfer from OR or PACU upon case conclusion : YES              > Use of PACU transfer checklist/protocol (includes the key elements of: patient identification, responsible practitioner identification (PACU nurse or advanced practitioner), discussion of pertinent history and procedure course, intraoperative  anesthetic management, post-procedure plans, acknowledgement/questions) : YES      14.  Transfer from OR or ICU upon case conclusion : NO                    15. Post-operative nausea/vomiting risk protocol. Patient > or = 18 with care initiated by anesthesia team that has a risk factor screen for post-op nausea/vomiting (Includes female, hx PONV, or motion sickness, non-smoker, intended opioid administration for post-op analgesia.) : NO    16.  Anaphylaxis secondary to anesthesia : NO      17.  Suspected transfusion reaction in association with blood-bank confirmed product incompatibility: NO        Lendell Caprice, 06/21/2014 11:34 AM

## 2014-06-21 NOTE — Progress Notes (Addendum)
Assessment of Pt revealed partial opening of incision; about an inch and a half long and half a cm. wide.  No drainage noted.  MD was contacted about situation.  MD stated the resident would reinforce closure with steri strips.    Resident Rostami Nia arrived, inspected wound and reapplied steri strips.    Site clean, dry and intact.  Will continue to monitor.

## 2014-06-21 NOTE — Progress Note - Problem Oriented Charting Notewrit (Signed)
POD #1    S: No acute ON events. Pain controlled. Ambulating, voiding without difficulty, tolerating diet. Lochia decreasing. Breastfeeding. No complaints. +flatus.    O:   Temp:  [97.2 F (36.2 C)-98.9 F (37.2 C)] 97.2 F (36.2 C)  Heart Rate:  [80-101] 82  Resp Rate:  [14-17] 14  BP: (93-138)/(50-68) 93/50 mmHg  UOP:   Intake/Output Summary (Last 24 hours) at 06/21/14 0912  Last data filed at 06/21/14 0830   Gross per 24 hour   Intake   1400 ml   Output   4400 ml   Net  -3000 ml       Gen NAD  Abd soft, appropriate ttp, FF < umbilicus  Inc C/D/I with steristrips  Ext trace pedal edema    Labs:   Lab Results   Component Value Date    WBC 12.58* 06/21/2014    RBC 3.55* 06/21/2014    HGB 9.9* 06/21/2014    HGB 14.5 04/12/2013    HCT 30.8* 06/21/2014    PLT 176 06/21/2014    PLT 240 04/12/2013       A/P 28yo G1P0101 POD #1 s/p 1LTCS, stable, afebrile  1. Rh+/Female infant "Flora Lipps" for circ today  2. Routine postop care  3. Abdominal binder  4. Possible d/c home tomorrow    [name], MD  [phone]

## 2014-06-22 NOTE — Lactation Note (Signed)
Follow up LC.  Baby just breastfed, sleeping in the crib.   Mom stated that baby bf well, able to sustain latch for 5 minutes.   Explained to mom difference between good and shallow latch.  Baby has been supplemented with formula per medical reason (36 wks).  Mom encouraged to call [facility] for latch check as needed.   Follow up PRN.

## 2014-06-22 NOTE — Progress Notes (Signed)
Post Op / Postpartum Note/POD#2    Subjective:  Delivery Type: Cesarean Section   No complaints. Pain well controlled. Minimal Bleeding, Breastfeeding well.    Objective:  Vital Signs:  Temp:  [98 F (36.7 C)-98.3 F (36.8 C)] 98 F (36.7 C)  Heart Rate:  [78-91] 80  Resp Rate:  [16] 16  BP: (87-114)/(48-70) 110/61 mmHg    Fundus: firm, non tender   Incision: clean, dry and intact   Lochia: moderate, rubra.  Lower Extremities: wnl edema, NT    Recent Labs:    Recent Labs  Lab 06/21/14  0516   WBC 12.58*   HEMOGLOBIN 9.9*   HEMATOCRIT 30.8*   PLATELETS 176       Assessment/Plan:EVents of previous evening noted  Steristrips clean /dry/intact/  Reassured pt. ?s answered x 20 mins  Incision site looks fine  Stable - Postpartum Day 2  Routine care, discharge 2 days, F/U in 6 weeks incision check.  Discharge Meds: PNV, Percocet.

## 2014-06-23 MED ORDER — OXYCODONE-ACETAMINOPHEN 5-325 MG PO TABS
1.0000 | ORAL_TABLET | ORAL | Status: DC | PRN
Start: 2014-06-23 — End: 2017-08-18

## 2014-06-23 MED ORDER — IBUPROFEN 600 MG PO TABS
600.0000 mg | ORAL_TABLET | Freq: Four times a day (QID) | ORAL | Status: AC | PRN
Start: 2014-06-23 — End: ?

## 2014-06-23 NOTE — Plan of Care (Signed)
Patient is stable and without questions or concerns at this time.  POC reviewed this shift and patient verbalizes understanding and continues to progress.    Patient pain level within patient stated goal: Yes  Patient breastfeeding infant: mother not attempting breastfeeding overnight   Patient pumping: No  Patient lochia appropriate: Yes, describe rubra, scant   Foley out and voiding: Yes  Abdominal incision clean, dry and well approximated: Yes  Nutrition adequate for discharge: Yes  Patient reports constipation: No   Patient agrees to notify RN of any changes in her condition/status: Yes

## 2014-06-23 NOTE — Discharge Instructions (Signed)
[facility]    David R Carlson, MD  John F Maddox, MD  David B Berry, MD  Glen H Silas, MD  Anne M Dobrzynski, MD  Ivette A Couret, MD  Lamin Chandley Patel-Grimm, MD  Nan Coleman RNC, ANP    [address]    [address]   [address]     [address]  [place], [place]  [address]    [place],   [address]  [phone] option#1   [phone]      POST PARTUM INSTRUCTIONS    THE DAY YOU GO HOME  Rest the remainder of the day.  Limit the amount of activity and visitors to prevent fatigue.    PLEASE CALL THE OFFICE TO SCHEDULE YOUR SIX (6) WEEK POSTPARTUM VISIT DURING YOUR FIRST WEEK AT HOME    INSURANCE/BACK TO WORK FORMS  We will be happy to assist you with forms that you may need.  Please give us ample notice.  Forms and letters cannot be done on a same day basis.    GENERAL ACTIVITY  Be sensible and plan your activity in moderation.  If in doubt about something, don't do it.  Gradually increase your activity each day, but strenuous work, heavy lifting and excessive social activity should be avoided.  Some rest each day is extremely important.     STAIRS   Limit to 1-2/day in the first week after a cesarean.  Let someone else carry baby up and down the stairs.  There are no limitation for vaginal deliveries as long as it does not contribute to excessive fatigue.     DRIVING   No driving after a cesarean x 2 weeks, assure you can slam on the brakes/check you blind spots and that you are not taking Percocet/narcotics nor excessively fatigued while driving.   BATHING/HYGIENE  You may bathe/shower as you like.  You may wish to wait until your bleeding decreases before tub baths.  Remember that warm sitz baths help with episiotomy or hemorrhoid discomfort.   For cesarean section:  keep the incision clean with water and pat dry.  No dressing is necessary.  Some numbness over the incision is expected, as is an occasional "pulling" sensation on the sides. Remove steristrips across your incision within  the first 2 weeks of delivery.  If the incision becomes red warm to touch, opens or drains, notify the office.          BLEEDING  Your bleeding may continue, slightly to moderately, for 1-6 weeks following delivery.  You may notice an increase in your flow as you become more active, or after breastfeeding.  You may also pass some clots.  If there is any heavy bleeding, let us know.  Do NOT use tampons until you have your first period.  Your first period may be heavier than normal.  It is not unusual NOT to have menstrual periods until you stop breast feeding.    SWELLING OF ANKLES  You may notice swelling of ankles/feet after delivery that may be even worse than before you delivered, especially after a cesarean section.  This is normal and will gradually go away as you increase your activity and remain well hydrated    CONSTIPATION  You may experience constipation after the birth of your baby.  Increase the fiber in your diet:  bran cereal, green vegetables, fresh fruit, prunes/prune juice.  Metamucil or miralax or milk of magnesia may be taken according to package instructions.    Colace or Pericolace stool softeners may be taken 1-2/day until normal bowel movements.    HEMORRHOIDS  Hemorrhoids that develop during pregnancy or with delivery may cause you pain, itching or bleeding.  They will respond to sitzbaths and witch hazel pads.  Preparation H or Anusol HC may also be helpful.  Prevention of constipation and avoidance of straining with bowel movements is essential    AFTERBIRTH PAINS  The cramps you experience after delivery should diminish over the first 48 hours after delivery.  You may experience an increase in your cramps with breastfeeding and increased activity.  You can take ibuprofen and Tylenol.  If this is your second/third+ baby your afterbirth pains may be more intense.    EPISIOTOMY CARE  Your episiotomy or laceration has been repaired with absorbable sutures which will dissolve over the next  6weeks.  Sitzbaths 3-4 times/day for the first two weeks will help with the healing of the stitches.  Use dermoplast/witch hazel pads for relief of pain.   Ibuprofen or Tylenol can also be used.  Avoid constipation or straining with bowel movements.  Occasionally, a small stitch may pass during bathing.  If at any time the site of your stitches becomes excessively reddened, worsens in pain, drainage of pus occurs, foul odor develops or you have a fever >101, call us.    MOODS  Your emotions may fluctuate widely after the baby arrives.  Lack of sleep may continue to make you feel tired, irritable and depressed.  Get rest as you are able; nap when the baby naps.  Sometimes extra assistance at home will allow you to rest more.  Getting out and being with friends will be helpful.  If your depression is getting worse, contact the office.            A GUIDE TO PELVIC MUSCLE EXERCISES (KEGEL'S)    Pelvic floor muscle exercises (Kegel's) improve bladder control by strengthening the pelvic floor muscle that supports the bladder. Most people who them correctly and long enough (at least 6 weeks) will note significant improvement or even cure. Improvement will last as long as you continue to do exercises.  To learn how to do the exercises, use one of these techniques to identify the pelvic floor muscle.    . Start to void. Once the urine stream has started, try to stop it. If the flow of urine stops, even slightly, the Correct muscle has been identified.    . Another technique is to squeeze the muscle in your rectum (not buttock muscle) that would prevent you from passing gas. If you feel a "pulling" or -tightening sensation, you are using the correct muscle.    . A woman also can identify the muscle by inserting a finger into her vagina and tightening her vagina around it. If she can feel this tightening, the correct muscle has been identified. Notes    . Once the correct muscle has been identified, you should not continue to  squeeze it during urination.    . You should squeeze the muscle just before you know a stress is coming (like sneezing, coughing, or bending over).    . From now on, only do exercises when you are not voiding.   Regardless of how you learn to exercise the pelvic muscle, it is important to place a hand on your stomach to make sure the stomach muscle is not being contracted. Also, it is important to make sure that your buttock muscle is not being   squeezed and that you remember to breathe. Once the proper muscle has been identified and can be squeezed without using the stomach or buttock muscle, you are ready to begin, in any position.   Daily exercise has two functions. It help's control the pelvic muscle and it increases its strength. Then this muscle can be used to gain control over urine loss.   Each exercise consists of squeezing and then relaxing the pelvic floor muscle. Squeeze the muscle for 3 seconds, and then relax it for 3 seconds. A squeeze and a relax is considered one exercise.   Do 45 exercises each day, divided into three sessions of 15 exercises each. The exact time of day is not critical, but finding time to do them each day is. Like any type of muscle training, these exercises take time to work. There is seldom much improvement before 6 weeks.   There are three reasons people find pelvic floor muscle exercises not helpful the correct muscle is not identified, the wrong muscle is exercised (eg. the buttock or stomach muscle), or the exercises are not pursued long enough to achieve benefit.     HOW DO I DO KEGELS? BRIEF VERSION:    You can do Kegels anywhere: while you sit at a desk, wait for a bus, wash dishes, drive a car, wait in line, or watch TV. No one will know you are doing them.   Here's how you do them:   . You can feel the muscles that need to be exercised by squeezing the muscles in your genital area. It might help to pretend you are stopping a flow of urine or trying to stop from passing  gas. .   Tighten these muscles and hold for 3 to 5 seconds. Then relax the muscles completely. Tighten and relax these muscles at least 10 to 20 times each day. Do these sets of exercises 10 times a day.   . Do not do these exercises while you are urinating or having a bowel movement.   You will probably have less leaking of urine after doing these exercises every day for 3 to 6 months. It is important to keep doing Kegels the rest of your life.    Developed by [employer]. Published by [employer]. Last modified: 2009,08-21  Last reviewed: 2007-11-02  This content is reviewed periodically and is subject to change as new health information becomes available. The information is intended to inform and educate and is not a replacement for medical evaluation, advice, diagnosis or treatment by a health care professional. A'   2009 [employer] and/or its affiliates. All Rights Reserved

## 2014-06-23 NOTE — Progress Notes (Addendum)
POD #3    S: No acute ON events. Pain controlled. Ambulating, voiding without difficulty, tolerating diet. Lochia decreasing. Breastfeeding. No complaints. +flatus. Baby Julia Carlson must go under bili lights so pt emotional.     O: Temp:  [98.2 F (36.8 C)-98.3 F (36.8 C)] 98.2 F (36.8 C)  Heart Rate:  [80-87] 80  Resp Rate:  [17-18] 18  BP: (100-102)/(53-61) 102/53 mmHg    Gen NAD  Abd soft, appropriate ttp, FF < umbilicus  Inc C/D/I with steristrips  Ext trace pedal edema    A/P 28yo G1P0101 POD #3 s/p 1LTCS, stable, afebrile  1. Rh+/Female infant "Julia Carlson"  2. Routine postop care  3. Abdominal binder  4. Pt has met all pp milestones and is stable for d/c home, but will go home tomorrow due to phototherapy required for infant; pp instructions reviewed. D/c order for tomorrow placed.    [name], MD  [phone]

## 2014-06-23 NOTE — Discharge Summary (Signed)
Obstetrics Discharge Summary    Delivery:  See Delivery Record    Puerperal Course: Normal    Discharge Examination: Normal    Diagnosis:  S/p 1CS    Instructions:    Pt has been given a discharge instruction sheet which includes diet & activity    Deshon Hsiao Patel-Grimm, MD  #[phone]

## 2014-06-24 NOTE — Plan of Care (Signed)
Patient is stable and without questions or concerns at this time.  POC reviewed this shift and patient verbalizes understanding and continues to progress.    Patient pain level within patient stated goal: Yes  Patient breastfeeding infant: pumping   Patient pumping: Yes  Patient lochia appropriate: Yes, describe rubra, scant   Foley out and voiding: Yes  Abdominal incision clean, dry and well approximated: Yes  Nutrition adequate for discharge: Yes  Patient reports constipation: No   Patient agrees to notify RN of any changes in her condition/status: Yes

## 2014-06-24 NOTE — Progress Notes (Signed)
POD #4    Afebrile VSS  No complaints  Baby's bili improved - will be rechecked at 3pm - if better to be discharged  Pt ready for discharge

## 2014-06-24 NOTE — Plan of Care (Signed)
Assesment wnl.Reviewed safety,hourly rounding &meds side effect.

## 2015-03-09 ENCOUNTER — Encounter (HOSPITAL_COMMUNITY): Payer: Self-pay | Admitting: *Deleted

## 2017-12-23 LAB — HEPATITIS B SURFACE ANTIGEN W/ REFLEX TO CONFIRMATION: Hepatitis B Surface Antigen: NEGATIVE

## 2017-12-23 LAB — RPR: RPR: NONREACTIVE

## 2017-12-23 LAB — HIV AG/AB 4TH GENERATION: HIV Ag/Ab, 4th Generation: NONREACTIVE

## 2017-12-23 LAB — RUBELLA ANTIBODY, IGG: Rubella AB, IgG: IMMUNE

## 2018-01-02 LAB — RPR: RPR: NONREACTIVE

## 2018-06-09 ENCOUNTER — Encounter (HOSPITAL_BASED_OUTPATIENT_CLINIC_OR_DEPARTMENT_OTHER): Payer: Self-pay

## 2018-06-13 LAB — GROUP B STREP TRANSCRIBED: GBS Transcribed: NEGATIVE

## 2018-07-04 ENCOUNTER — Encounter (HOSPITAL_BASED_OUTPATIENT_CLINIC_OR_DEPARTMENT_OTHER): Payer: Self-pay

## 2018-07-11 ENCOUNTER — Inpatient Hospital Stay
Admission: AD | Admit: 2018-07-11 | Discharge: 2018-07-13 | DRG: 788 | Disposition: A | Discharge status: Home / Self Care | Payer: Commercial Managed Care - POS | Source: Ambulatory Visit | Attending: Obstetrics & Gynecology | Admitting: Obstetrics & Gynecology

## 2018-07-11 ENCOUNTER — Observation Stay (HOSPITAL_BASED_OUTPATIENT_CLINIC_OR_DEPARTMENT_OTHER): Payer: Commercial Managed Care - POS | Admitting: Anesthesiology

## 2018-07-11 ENCOUNTER — Encounter (HOSPITAL_BASED_OUTPATIENT_CLINIC_OR_DEPARTMENT_OTHER)
Admission: AD | Disposition: A | Discharge status: Home / Self Care | Payer: Self-pay | Source: Ambulatory Visit | Attending: Obstetrics & Gynecology

## 2018-07-11 ENCOUNTER — Encounter (HOSPITAL_BASED_OUTPATIENT_CLINIC_OR_DEPARTMENT_OTHER): Payer: Self-pay

## 2018-07-11 ENCOUNTER — Ambulatory Visit (HOSPITAL_BASED_OUTPATIENT_CLINIC_OR_DEPARTMENT_OTHER): Payer: Self-pay

## 2018-07-11 DIAGNOSIS — Z3A39 39 weeks gestation of pregnancy: Secondary | ICD-10-CM

## 2018-07-11 DIAGNOSIS — Z98891 History of uterine scar from previous surgery: Secondary | ICD-10-CM

## 2018-07-11 DIAGNOSIS — Z3490 Encounter for supervision of normal pregnancy, unspecified, unspecified trimester: Secondary | ICD-10-CM

## 2018-07-11 DIAGNOSIS — O34211 Maternal care for low transverse scar from previous cesarean delivery: Principal | ICD-10-CM | POA: Diagnosis present

## 2018-07-11 HISTORY — DX: Pregnant state, incidental: Z33.1

## 2018-07-11 LAB — TYPE AND SCREEN
AB Screen Gel: NEGATIVE
ABO Rh: O POS

## 2018-07-11 LAB — CBC
Absolute NRBC: 0 10*3/uL (ref 0.00–0.00)
Hematocrit: 41 % (ref 34.7–43.7)
Hgb: 13 g/dL (ref 11.4–14.8)
MCH: 28.1 pg (ref 25.1–33.5)
MCHC: 31.7 g/dL (ref 31.5–35.8)
MCV: 88.7 fL (ref 78.0–96.0)
MPV: 9.6 fL (ref 8.9–12.5)
Nucleated RBC: 0 /100 WBC (ref 0.0–0.0)
Platelets: 262 10*3/uL (ref 142–346)
RBC: 4.62 10*6/uL (ref 3.90–5.10)
RDW: 16 % — ABNORMAL HIGH (ref 11–15)
WBC: 7.92 10*3/uL (ref 3.10–9.50)

## 2018-07-11 SURGERY — Surgical Case
Anesthesia: Regional | Site: Abdomen | Wound class: Clean Contaminated

## 2018-07-11 MED ORDER — ONDANSETRON HCL 4 MG/2ML IJ SOLN
INTRAMUSCULAR | Status: AC
Start: 2018-07-11 — End: ?
  Filled 2018-07-11: qty 2

## 2018-07-11 MED ORDER — PROMETHAZINE HCL 25 MG/ML IJ SOLN
6.2500 mg | Freq: Four times a day (QID) | INTRAMUSCULAR | Status: DC | PRN
Start: 2018-07-11 — End: 2018-07-13
  Filled 2018-07-11: qty 1

## 2018-07-11 MED ORDER — OXYCODONE HCL 5 MG PO TABS
5.0000 mg | ORAL_TABLET | ORAL | Status: DC | PRN
Start: 2018-07-11 — End: 2018-07-13

## 2018-07-11 MED ORDER — FAMOTIDINE 20 MG/2ML IV SOLN
INTRAVENOUS | Status: AC
Start: 2018-07-11 — End: ?
  Filled 2018-07-11: qty 2

## 2018-07-11 MED ORDER — MAGNESIUM HYDROXIDE 400 MG/5ML PO SUSP
30.0000 mL | Freq: Four times a day (QID) | ORAL | Status: DC | PRN
Start: 2018-07-11 — End: 2018-07-13

## 2018-07-11 MED ORDER — PROMETHAZINE HCL 12.5 MG PO TABS
12.5000 mg | ORAL_TABLET | Freq: Four times a day (QID) | ORAL | Status: DC | PRN
Start: 2018-07-11 — End: 2018-07-13

## 2018-07-11 MED ORDER — TRIADVANCE 90-1 MG PO TABS
1.0000 | ORAL_TABLET | Freq: Every day | ORAL | Status: DC
Start: 2018-07-11 — End: 2018-07-13
  Administered 2018-07-12 – 2018-07-13 (×2): 1 via ORAL
  Filled 2018-07-11 (×2): qty 1

## 2018-07-11 MED ORDER — AMMONIA AROMATIC IN INHA
1.0000 | Freq: Once | RESPIRATORY_TRACT | Status: DC | PRN
Start: 2018-07-11 — End: 2018-07-13

## 2018-07-11 MED ORDER — OXYTOCIN 10 UNIT/ML IJ SOLN
INTRAMUSCULAR | Status: AC
Start: 2018-07-11 — End: ?
  Filled 2018-07-11: qty 1

## 2018-07-11 MED ORDER — BISACODYL 10 MG RE SUPP
10.0000 mg | Freq: Every day | RECTAL | Status: DC | PRN
Start: 2018-07-11 — End: 2018-07-13

## 2018-07-11 MED ORDER — NALOXONE HCL 0.4 MG/ML IJ SOLN (WRAP)
0.10 mg | INTRAMUSCULAR | Status: DC | PRN
Start: 2018-07-11 — End: 2018-07-13

## 2018-07-11 MED ORDER — FENTANYL CITRATE (PF) 50 MCG/ML IJ SOLN (WRAP)
25.0000 ug | INTRAMUSCULAR | Status: DC | PRN
Start: 2018-07-11 — End: 2018-07-11

## 2018-07-11 MED ORDER — SODIUM CHLORIDE (PF) 0.9 % IJ SOLN
3.0000 mL | Freq: Three times a day (TID) | INTRAMUSCULAR | Status: DC
Start: 2018-07-12 — End: 2018-07-13

## 2018-07-11 MED ORDER — SENNOSIDES-DOCUSATE SODIUM 8.6-50 MG PO TABS
1.0000 | ORAL_TABLET | Freq: Every evening | ORAL | Status: DC | PRN
Start: 2018-07-11 — End: 2018-07-13

## 2018-07-11 MED ORDER — DIPHENHYDRAMINE HCL 50 MG/ML IJ SOLN
6.2500 mg | INTRAMUSCULAR | Status: AC | PRN
Start: 2018-07-11 — End: 2018-07-12

## 2018-07-11 MED ORDER — OXYTOCIN-SODIUM CHLORIDE 30-0.9 UT/500ML-% IV SOLN
7.5000 [IU]/h | INTRAVENOUS | Status: AC
Start: 2018-07-11 — End: 2018-07-11
  Administered 2018-07-11: 13:00:00 7.5 [IU]/h via INTRAVENOUS

## 2018-07-11 MED ORDER — ACETAMINOPHEN 500 MG PO TABS
1000.0000 mg | ORAL_TABLET | Freq: Four times a day (QID) | ORAL | Status: AC
Start: 2018-07-11 — End: 2018-07-12
  Administered 2018-07-11 – 2018-07-12 (×4): 1000 mg via ORAL
  Filled 2018-07-11 (×5): qty 2

## 2018-07-11 MED ORDER — SIMETHICONE 80 MG PO CHEW
80.0000 mg | CHEWABLE_TABLET | Freq: Four times a day (QID) | ORAL | Status: DC | PRN
Start: 2018-07-11 — End: 2018-07-12

## 2018-07-11 MED ORDER — MISOPROSTOL 200 MCG PO TABS
800.0000 ug | ORAL_TABLET | Freq: Once | ORAL | Status: DC | PRN
Start: 2018-07-11 — End: 2018-07-13

## 2018-07-11 MED ORDER — SODIUM CHLORIDE (PF) 0.9 % IJ SOLN
3.0000 mL | Freq: Three times a day (TID) | INTRAMUSCULAR | Status: DC
Start: 2018-07-11 — End: 2018-07-13

## 2018-07-11 MED ORDER — METHYLERGONOVINE MALEATE 0.2 MG/ML IJ SOLN
200.0000 ug | INTRAMUSCULAR | Status: DC | PRN
Start: 2018-07-11 — End: 2018-07-13

## 2018-07-11 MED ORDER — ONDANSETRON 4 MG PO TBDP
4.0000 mg | ORAL_TABLET | Freq: Three times a day (TID) | ORAL | Status: DC | PRN
Start: 2018-07-11 — End: 2018-07-13

## 2018-07-11 MED ORDER — FENTANYL CITRATE (PF) 50 MCG/ML IJ SOLN (WRAP)
INTRAMUSCULAR | Status: DC | PRN
Start: 2018-07-11 — End: 2018-07-11
  Administered 2018-07-11: 11:00:00 2.15 mL via INTRASPINAL

## 2018-07-11 MED ORDER — ONDANSETRON HCL 4 MG/2ML IJ SOLN
4.0000 mg | Freq: Three times a day (TID) | INTRAMUSCULAR | Status: DC | PRN
Start: 2018-07-11 — End: 2018-07-13

## 2018-07-11 MED ORDER — PROMETHAZINE HCL 12.5 MG RE SUPP
12.5000 mg | Freq: Four times a day (QID) | RECTAL | Status: DC | PRN
Start: 2018-07-11 — End: 2018-07-13

## 2018-07-11 MED ORDER — LACTATED RINGERS IV SOLN
125.0000 mL/h | INTRAVENOUS | Status: AC
Start: 2018-07-11 — End: 2018-07-12

## 2018-07-11 MED ORDER — LACTATED RINGERS IV SOLN
INTRAVENOUS | Status: DC | PRN
Start: 2018-07-11 — End: 2018-07-11

## 2018-07-11 MED ORDER — LACTATED RINGERS IV BOLUS
1000.0000 mL | Freq: Once | INTRAVENOUS | Status: AC
Start: 2018-07-11 — End: 2018-07-11
  Administered 2018-07-11: 1000 mL via INTRAVENOUS

## 2018-07-11 MED ORDER — MEPERIDINE HCL 25 MG/ML IJ SOLN
12.5000 mg | INTRAMUSCULAR | Status: DC | PRN
Start: 2018-07-11 — End: 2018-07-11
  Filled 2018-07-11: qty 1

## 2018-07-11 MED ORDER — METOCLOPRAMIDE HCL 5 MG/ML IJ SOLN
10.0000 mg | Freq: Four times a day (QID) | INTRAMUSCULAR | Status: AC | PRN
Start: 2018-07-11 — End: 2018-07-12

## 2018-07-11 MED ORDER — CEFAZOLIN SODIUM-DEXTROSE 2-3 GM-%(50ML) IV SOLR
2.0000 g | INTRAVENOUS | Status: AC
Start: 2018-07-11 — End: 2018-07-11
  Administered 2018-07-11: 2 g via INTRAVENOUS

## 2018-07-11 MED ORDER — ONDANSETRON HCL 4 MG/2ML IJ SOLN
INTRAMUSCULAR | Status: DC | PRN
Start: 2018-07-11 — End: 2018-07-11
  Administered 2018-07-11: 4 mg via INTRAVENOUS

## 2018-07-11 MED ORDER — NALOXONE HCL 0.4 MG/ML IJ SOLN (WRAP)
0.2000 mg | INTRAMUSCULAR | Status: DC | PRN
Start: 2018-07-11 — End: 2018-07-13

## 2018-07-11 MED ORDER — PHENYLEPHRINE 100 MCG/ML IV SOSY (WRAP)
PREFILLED_SYRINGE | INTRAVENOUS | Status: DC | PRN
Start: 2018-07-11 — End: 2018-07-11
  Administered 2018-07-11 (×2): 100 ug via INTRAVENOUS

## 2018-07-11 MED ORDER — NALBUPHINE HCL 10 MG/ML IJ SOLN
5.00 mg | INTRAMUSCULAR | Status: AC | PRN
Start: 2018-07-11 — End: 2018-07-12
  Filled 2018-07-11 (×7): qty 0.5
  Filled 2018-07-11: qty 1

## 2018-07-11 MED ORDER — ACETAMINOPHEN 650 MG RE SUPP
650.0000 mg | Freq: Four times a day (QID) | RECTAL | Status: DC | PRN
Start: 2018-07-11 — End: 2018-07-12

## 2018-07-11 MED ORDER — DOCUSATE SODIUM 100 MG PO CAPS
200.0000 mg | ORAL_CAPSULE | Freq: Two times a day (BID) | ORAL | Status: DC
Start: 2018-07-11 — End: 2018-07-13
  Administered 2018-07-11 – 2018-07-13 (×3): 200 mg via ORAL
  Filled 2018-07-11 (×4): qty 2

## 2018-07-11 MED ORDER — MORPHINE SULFATE (PF) 1 MG/ML SYRINGE 1 ML (NICU)
INTRAMUSCULAR | Status: AC
Start: 2018-07-11 — End: ?
  Filled 2018-07-11: qty 1

## 2018-07-11 MED ORDER — LANOLIN EX OINT
TOPICAL_OINTMENT | CUTANEOUS | Status: DC | PRN
Start: 2018-07-11 — End: 2018-07-13

## 2018-07-11 MED ORDER — FAMOTIDINE 10 MG/ML IV SOLN (WRAP)
INTRAVENOUS | Status: DC | PRN
Start: 2018-07-11 — End: 2018-07-11
  Administered 2018-07-11: 20 mg via INTRAVENOUS

## 2018-07-11 MED ORDER — LACTATED RINGERS IV SOLN
INTRAVENOUS | Status: DC
Start: 2018-07-11 — End: 2018-07-13

## 2018-07-11 MED ORDER — ACETAMINOPHEN 325 MG PO TABS
650.0000 mg | ORAL_TABLET | Freq: Four times a day (QID) | ORAL | Status: DC | PRN
Start: 2018-07-11 — End: 2018-07-12

## 2018-07-11 MED ORDER — WITCH HAZEL-GLYCERIN EX PADS
1.0000 | MEDICATED_PAD | CUTANEOUS | Status: DC | PRN
Start: 2018-07-11 — End: 2018-07-13

## 2018-07-11 MED ORDER — FENTANYL CITRATE (PF) 50 MCG/ML IJ SOLN (WRAP)
INTRAMUSCULAR | Status: AC
Start: 2018-07-11 — End: ?
  Filled 2018-07-11: qty 2

## 2018-07-11 MED ORDER — IBUPROFEN 600 MG PO TABS
600.0000 mg | ORAL_TABLET | Freq: Four times a day (QID) | ORAL | Status: DC
Start: 2018-07-12 — End: 2018-07-13
  Administered 2018-07-12 – 2018-07-13 (×4): 600 mg via ORAL
  Filled 2018-07-11 (×4): qty 1

## 2018-07-11 MED ORDER — SOD CITRATE-CITRIC ACID 500-334 MG/5ML PO SOLN
30.0000 mL | Freq: Once | ORAL | Status: DC | PRN
Start: 2018-07-11 — End: 2018-07-13

## 2018-07-11 MED ORDER — OXYCODONE HCL 5 MG PO TABS
5.0000 mg | ORAL_TABLET | ORAL | Status: AC | PRN
Start: 2018-07-11 — End: 2018-07-12

## 2018-07-11 MED ORDER — IBUPROFEN 600 MG PO TABS
ORAL_TABLET | ORAL | Status: AC
Start: 2018-07-11 — End: ?
  Filled 2018-07-11: qty 1

## 2018-07-11 MED ORDER — OXYTOCIN 10 UNIT/ML IJ SOLN
INTRAMUSCULAR | Status: DC | PRN
Start: 2018-07-11 — End: 2018-07-11
  Administered 2018-07-11: 30 [IU] via INTRAVENOUS

## 2018-07-11 MED ORDER — ONDANSETRON HCL 4 MG/2ML IJ SOLN
4.0000 mg | Freq: Three times a day (TID) | INTRAMUSCULAR | Status: AC | PRN
Start: 2018-07-11 — End: 2018-07-12

## 2018-07-11 MED ORDER — CLOBETASOL PROPIONATE 0.05 % EX CREA
TOPICAL_CREAM | Freq: Two times a day (BID) | CUTANEOUS | Status: DC
Start: 2018-07-13 — End: 2018-07-13
  Filled 2018-07-11: qty 15

## 2018-07-11 MED ORDER — BENZOCAINE 20% +/- MENTHOL 0.5% EX AERO (WRAP)
1.0000 | INHALATION_SPRAY | CUTANEOUS | Status: DC | PRN
Start: 2018-07-11 — End: 2018-07-13

## 2018-07-11 MED ORDER — OXYTOCIN-SODIUM CHLORIDE 30-0.9 UT/500ML-% IV SOLN
INTRAVENOUS | Status: AC
Start: 2018-07-11 — End: ?
  Filled 2018-07-11: qty 500

## 2018-07-11 MED ORDER — HYDROCORTISONE 1 % EX OINT
TOPICAL_OINTMENT | Freq: Three times a day (TID) | CUTANEOUS | Status: DC | PRN
Start: 2018-07-11 — End: 2018-07-13

## 2018-07-11 MED ORDER — IBUPROFEN 600 MG PO TABS
600.0000 mg | ORAL_TABLET | Freq: Four times a day (QID) | ORAL | Status: AC
Start: 2018-07-11 — End: 2018-07-12
  Administered 2018-07-11 – 2018-07-12 (×4): 600 mg via ORAL
  Filled 2018-07-11 (×3): qty 1

## 2018-07-11 SURGICAL SUPPLY — 39 items
CLEANER ELECTROSURGICAL TIP PENCIL (Procedure Accessories) ×1
CLEANER ELECTROSURGICAL TIP PENCIL HANDSWITCH LECTROBRASIVE (Procedure Accessories) ×1 IMPLANT
CLEANER ESURG TIP PNCL LCTRBRS STRL (Procedure Accessories) ×1
DRESSING COVERLET 4X8 (Dressing) ×2 IMPLANT
DRESSING TELFA 3X8IN STERILE (Dressing) ×2 IMPLANT
ELECTRODE ADULT PATIENT RETURN L9 FT REM POLYHESIVE ACRYLIC FOAM (Procedure Accessories) ×1 IMPLANT
ELECTRODE PATIENT RETURN L9 FT VALLEYLAB (Procedure Accessories) ×1
ELECTRODE PT RTN RM PHSV ACRL FM C30- LB (Procedure Accessories) ×1
GLOVE SRG PLISPRN 7 BGL PI INDCTR (Glove) ×1
GLOVE SRG PLISPRN 7 BGL PI MIC 288MM LF (Glove) ×1
GLOVE SURGICAL 7 BIOGEL PI INDICATOR (Glove) ×1
GLOVE SURGICAL 7 BIOGEL PI INDICATOR UNDERGLOVE POWDER FREE SMOOTH (Glove) ×1 IMPLANT
GLOVE SURGICAL 7 BIOGEL PI MICRO POWDER (Glove) ×1
GLOVE SURGICAL 7 BIOGEL PI MICRO POWDER FREE BEAD CUFF MICRO ROUGHEN (Glove) ×1 IMPLANT
PEN SRGMRK 6IN LF STRL RLR LG RSRV REG (Positioning Supplies) ×1
PEN SURGICAL MARKING MEDLINE SKIN RULER (Positioning Supplies) ×1
PEN SURGICAL MARKING SKIN RULER LARGE RESERVOIR REGULAR TIP LABEL (Positioning Supplies) ×1 IMPLANT
PENCIL ELECTRO PUSH BUTTON (Cautery) ×2 IMPLANT
SLEEVE CMPR MED KN LGTH KDL SCD 21- IN (Sleeve) ×2
SLEEVE COMPRESSION MEDIUM KNEE LENGTH KENDALL SEQUENTIAL OD21- IN (Sleeve) ×1 IMPLANT
STRIP SKIN CLOSURE L4 IN X W1/2 IN (Dressing) ×1
STRIP SKIN CLOSURE L4 IN X W1/2 IN REINFORCE STERI-STRIP POLYESTER (Dressing) ×1 IMPLANT
STRIP SKNCLS PLSTR STRSTRP 4X.5IN LF (Dressing) ×1
SUTURE ABS 0 CT1 PDS2 27IN MFL VIOL (Suture) ×1
SUTURE ABS 0 CT1 VCL 27IN BRD COAT UD (Suture) ×1
SUTURE ABS 0 CTX MNCRL 36IN MFL VIOL (Suture) ×2
SUTURE ABS 2-0 CT1 VCL 27IN BRD COAT UD (Suture) ×1
SUTURE ABS CR 0 CT1 27IN MFL BRN (Suture) ×1
SUTURE CHROMIC GUT CHROMIC 0 CT-1 L27 IN (Suture) ×1
SUTURE CHROMIC GUT CHROMIC 0 CT-1 L27 IN MONOFILAMENT BROWN ABSORBABLE (Suture) ×1 IMPLANT
SUTURE COATED VICRYL 0 CT-1 L27 IN BRAID (Suture) ×1
SUTURE COATED VICRYL 0 CT-1 L27 IN BRAID COATED UNDYED ABSORBABLE (Suture) ×1 IMPLANT
SUTURE COATED VICRYL 2-0 CT-1 L27 IN (Suture) ×1
SUTURE COATED VICRYL 2-0 CT-1 L27 IN BRAID COATED UNDYED ABSORBABLE (Suture) ×1 IMPLANT
SUTURE MONOCRYL 0 CTX L36 IN (Suture) ×2
SUTURE MONOCRYL 0 CTX L36 IN MONOFILAMENT VIOLET ABSORBABLE (Suture) ×2 IMPLANT
SUTURE PDS II 0 CT-1 L27 IN MONOFILAMENT (Suture) ×1
SUTURE PDS II 0 CT-1 L27 IN MONOFILAMENT VIOLET ABSORBABLE (Suture) ×1 IMPLANT
SUTURE PLAIN 2-0 CT1 27IN (Suture) ×2 IMPLANT

## 2018-07-11 NOTE — Anesthesia Preprocedure Evaluation (Addendum)
Anesthesia Evaluation    AIRWAY    Mallampati: IV    TM distance: >3 FB  Neck ROM: full  Mouth Opening:full  Planned to use difficult airway equipment: No CARDIOVASCULAR    cardiovascular exam normal       DENTAL         PULMONARY    pulmonary exam normal     OTHER FINDINGS    G2P1 39+ week IUP s/f rpeat c/s x 1. Spinal for previous CS w/o complications. PLT 262K, H/H 13.0/41  NPO>MN          Relevant Problems   No relevant active problems               Anesthesia Plan    ASA 2     spinal                     Detailed anesthesia plan: spinal        Post op pain management: intrathecal and PO analgesics    informed consent obtained    Plan discussed with CRNA and resident.      pertinent labs reviewed             Signed by: Godfrey Pick [facility] 07/11/18 9:01 AM

## 2018-07-11 NOTE — Brief Op Note (Signed)
BRIEF OP NOTE    Date Time: 07/11/18 12:10 PM    Patient Name:   [name]    Date of Operation:   07/11/2018    Providers Performing:   Surgeon(s):  Shota Kohrs, Norvel Richards, MD    Assistant (s):   Circulator: Donnamae Jude, RN  Scrub Person: Madalyn Rob  First Assistant: Conception Chancy    Operative Procedure:   Procedure(s):  CESAREAN SECTION REPEAT LOW TRANSVERSE    Preoperative Diagnosis:   Pre-Op Diagnosis Codes:     * Previous cesarean delivery, antepartum condition or complication [O34.219]    Postoperative Diagnosis:   Post-Op Diagnosis Codes:     * Previous cesarean delivery, antepartum condition or complication [O34.219]  LIVEBORN FEMALE INFANT, Julia Carlson  Anesthesia:   Spinal    Estimated Blood Loss:    500 mL    Implants:   * No implants in log *    Drains:   Drains: Yes, foley with 150 cc clear urine    Specimens:   * No specimens in log *      Findings:   Liveborn female infant in vertex presentation with no nuchal cord and clear fluid.  Strong cry and vigorous tone on delivery.  Intact placenta with 3-vessel cord.  Normal uterine outline and bilateral tubes and ovaries.  Her name is Julia Carlson and time of delivery was 1136 am.  Weight 3360 grams with Apgar's of 8 at one minute and 9 at five minutes.    Complications:    none.    Please see separate operative report for details of the procedure.    Norvel Richards. Yukari Flax MD [phone]  [phone]        Signed by: Ardell Isaacs, MD                                                                           [facility] OR

## 2018-07-11 NOTE — H&P (Signed)
Subjective:   Julia Carlson is a 33 y.o. G2 P1 female with EDC 07/17/2018 at 23 and 1/[redacted] weeks gestation who is being admitted for C-section.  Her current obstetrical history is significant for prior c-section.  Patient reports no complaints.   Fetal Movement: normal.   Past Medical History:   Diagnosis Date    Oligohydramnios antepartum 2016    with previous pregnancy    Pregnant state, incidental    .        Objective:     Vital signs in last 24 hours:  Temp:  [98.8 F (37.1 C)] 98.8 F (37.1 C)  Heart Rate:  [90] 90  Resp Rate:  [18] 18  BP: (113)/(60) 113/60      General:   alert, appears stated age and cooperative   Skin:   normal       Lungs:   clear to auscultation bilaterally   Heart:   regular rate and rhythm, S1, S2 normal, no murmur, click, rub or gallop   Breasts:   normal without suspicious masses, skin or nipple changes or axillary nodes and self-exam is taught and encouraged   Abdomen:  soft, non-tender; bowel sounds normal; no masses,  no organomegaly   Pelvis:  deferred   FHT:  140s BPM   Uterine Size: size equals dates   Presentations: cephalic   Cervix: deferred   Lab Review   O, Rh+, Rubella-immune, Hepatitis B surface antigen non-reactive, GBS negative     One hour GTT: Normal      Assessment/Plan:   39 and 1/[redacted] weeks gestation.  Not in labor.  Obstetrical history significant for prior c-section.       Risks, benefits, alternatives and possible complications have been discussed in detail with the patient.  Pre-admission, admission, and post admission procedures and expectations were discussed in detail.  All questions answered, all appropriate consents will be signed at the Hospital. Admission is planned for today.   Intervention: plan Cesarean delivery    Norvel Richards. Emet Rafanan MD [phone]  [phone]

## 2018-07-11 NOTE — Anesthesia Postprocedure Evaluation (Signed)
Anesthesia Post Evaluation    Patient: Julia Carlson    Procedure(s):  CESAREAN SECTION    Anesthesia type: spinal    Last Vitals:   Vitals Value Taken Time   BP 100/55 07/11/2018  2:15 PM   Temp 36.6 C (97.8 F) 07/11/2018  2:15 PM   Pulse 77 07/11/2018  2:15 PM   Resp 16 07/11/2018  2:15 PM   SpO2 98 % 07/11/2018  2:15 PM       Anesthesia Post Evaluation:     Patient Evaluated: PACU  Patient Participation: complete - patient participated  Level of Consciousness: awake and alert    Pain Management: adequate    Airway Patency: patent    Anesthetic complications: No      PONV Status: none    Cardiovascular status: acceptable  Respiratory status: acceptable  Hydration status: acceptable        Anesthesia Qualified Clinical Data Registry 2018    PACU Reintubation  Did the Patient have general anesthesia with intubation: No        PONV Adult  Is the patient aged 78 or older: Yes  Did the patient receive recieve a general anesthestic: No          PONV Pediatric  Is the patient aged 101-17? No            PACU Transfer Checklist Protocol  Was the patient transferred to the PACU at the conclusion of surgery? Yes  Was a checklist or transfer protocol used? Yes    ICU Transfer Checklist Protocol  Was the patient transferred to the ICU at the conclusion of surgery? No      Post-op Pain Assessment Prior to Anesthesia Care End  Age >=18 and assessed for pain in PACU: Yes  Pacu pain score <7/10: Yes      Perioperative Mortality  Perioperative mortality prior to Anesthesia end time: No    Perioperative Cardiac Arrest  Did the patient have an unanticipated intraoperative cardiac arrest between anesthesia start time and anesthesia end time? No    Unplanned Admission to ICU  Did the patient have an unplanned admission to the ICU (not initially anticipated at anesthesia start time)? No      Signed by: Silvio Pate, 07/11/2018 2:37 PM

## 2018-07-11 NOTE — Transfer of Care (Signed)
Anesthesia Transfer of Care Note    Patient: Julia Carlson    Procedures performed: Procedure(s):  CESAREAN SECTION    Anesthesia type: Spinal    Patient location:Phase I PACU    Last vitals:   Vitals:    07/11/18 1220   BP: 107/53   Pulse:    Resp:    Temp:        Post pain: Patient not complaining of pain, continue current therapy      Mental Status:awake    Respiratory Function: tolerating room air    Cardiovascular: stable    Nausea/Vomiting: patient not complaining of nausea or vomiting    Hydration Status: adequate    Post assessment: no apparent anesthetic complications and no reportable events    Signed by: Clemon Chambers Emin Foree  07/11/18 12:21 PM

## 2018-07-11 NOTE — Progress Notes (Signed)
Patient admitted to [facility]. VSS, fundus firm and midline, lochia WNL. Pain well controlled with medications at this time. Medication side effects reviewed with patient. RN provided introduction to unit, how/when to use emergency cords, and reviewed basic care and safety measures. Reviewed plan of care with patient and patient verbalized understanding, no questions at this time. Will continue with plan of care.

## 2018-07-11 NOTE — Op Note (Signed)
Cesarean Section Procedure Note    Indications: HISTORY OF PRIOR C-SECTION FOR BREECH AND OLIGOHYDRAMNIOS WHO DESIRES A REPEAT C-SECTION    Pre-operative Diagnosis: 39 week 1 day pregnancy and prior c-section    Post-operative Diagnosis: same and liveborn female infant, Autumn    Surgeon: Norvel Richards Sebasthian Stailey     Assistants: Conception Chancy, SA    Anesthesia:  Spinal    Operative Procedure:   Procedure(s):  CESAREAN SECTION, REPEAT LOW TRANSVERSE               Procedure Details   The patient was seen in the Holding Room. The risks, benefits, complications, treatment options, and expected outcomes were discussed with the patient.  The patient concurred with the proposed plan, giving informed consent.  The site of surgery properly noted/marked. The patient was taken to Operating Room #10 , identified as Julia Carlson and the procedure verified as C-Section Delivery. A Time Out was held and the above information confirmed.    After induction of anesthesia, the patient was draped and prepped in the usual sterile manner. A Pfannenstiel incision was made with excision of mid portion that had keloided and carried down through the subcutaneous tissue to the fascia. Fascial incision was made and extended transversely. The fascia was separated from the underlying rectus tissue superiorly and inferiorly. The peritoneum was identified and entered. Peritoneal incision was extended longitudinally. The utero-vesical peritoneal reflection was incised transversely and the bladder flap was sharply freed from the lower uterine segment. A low transverse uterine incision was made. Delivered from vertex presentation was a 3360 gram female infant with Apgar scores of 8 at one minute and 9 at five minutes.  Delayed cord clamping was performed.  After the umbilical cord was clamped and cut the baby was handed to the NICU team. The placenta was removed intact and appeared normal.  It was handed to the [facility] Cord Blood Program staff for  donation. The uterine outline, tubes and ovaries appeared normal. The uterine incision was closed with running locked sutures of 0 Monocryl in an imbricating layer. Hemostasis was observed.  Lavage was carried out until clear.  More irrigation was performed.  The undersides of the fascia were inspected and any bleeding controlled with cautery.  The peritoneum was re-approximated with 2-0 Vicryl in a running fashion.  The muscles were re-approximated with interrupted mattress sutures of 2-0 chromic.  The fascia was then reapproximated with running sutures of 0 PDS. The subcutaneous tissue was irrigated and any bleeding controlled with cautery and then re-approximated with 2-0 Plain.  The skin was reapproximated in a subcuticular manner with 4-0 Monocryl.  Several areas of keloid on the incision were excised.    Instrument, sponge, and needle counts were correct prior the abdominal closure and at the conclusion of the case.     Findings:  Liveborn female infant in vertex presentation with no nuchal cord and clear fluid.  Strong cry and vigorous tone on delivery.  Intact placenta with 3-vessel cord.  Normal uterine outline and bilateral tubes and ovaries.  Her name is Autumn and time of delivery was 1136 am.  Weight 3360 grams with Apgar's of 8 at one minute and 9 at five minutes    Estimated Blood Loss: 500  cc           Drains: foley with 150 cc clear urine post procedure           Total IV Fluids:  Per anesthesia  Specimens: none.           Implants: none.           Complications:  None; patient tolerated the procedure well.           Disposition: PACU - hemodynamically stable.           Condition: stable    Attending Attestation: I performed the procedure.    Norvel Richards. Porche Steinberger MD [phone]  [phone]

## 2018-07-11 NOTE — Discharge Instr - AVS First Page (Addendum)
[facility]    Clovis Cao, MD  Patriciaann Clan, MD  Delmer Islam, MD  Ardell Isaacs, MD  Magda Bernheim, MD  [name], MD  Claretta Fraise RNC, ANP    [address]    [phone] [facility].   [address]  [place], [place]  [address]    [place], [place]  [address]  [phone] option#1   [phone] option#2      POST PARTUM INSTRUCTIONS    THE DAY YOU GO HOME  Rest the remainder of the day.  Limit the amount of activity and visitors to prevent fatigue.    PLEASE CALL THE OFFICE TO SCHEDULE YOUR TWO (2) WEEK INCISION CHECK FOR THE TEMOVATE YOU ARE USING AND A SIX (6) WEEK POSTPARTUM VISIT DURING YOUR FIRST WEEK AT HOME    INSURANCE/BACK TO WORK FORMS  We will be happy to assist you with forms that you may need.  Please give [place] ample notice.  Forms and letters cannot be done on a same day basis.    GENERAL ACTIVITY  Be sensible and plan your activity in moderation.  If in doubt about something, dont do it.  Gradually increase your activity each day, but strenuous work, heavy lifting and excessive social activity should be avoided.  Some rest each day is extremely important.     STAIRS   Limit to 1-2/day in the first week after a cesarean.  Let someone else carry baby up and down the stairs.  There are no limitation for vaginal deliveries as long as it does not contribute to excessive fatigue.     DRIVING   No driving after a cesarean x 2 weeks, assure you can slam on the brakes/check you blind spots and that you are not taking Percocet/narcotics nor excessively fatigued.   BATHING/HYGIENE  You may bathe/shower as you like.  You may wish to wait until your bleeding decreases before tub baths.  Remember that warm sitzbaths help with episiotomy or hemorrhoid discomfort.   For cesarean section:  keep the incision clean and dry with water.  No dressing is necessary.  Some numbness over the incision is expected, as is an occasional pulling sensation on the sides. Steristrips  across your incision within the first 2 weeks of delivery.  If the incision becomes red warm to touch, opens or drains, notify the office.    TEMOVATE USE:  Continue to apply a thin layer of the temovate cream above and below your incision with clean hands twice a day for two weeks.  At your incision check appointment we will remind you to decrease to once a day for the next 4 weeks.  At your postpartum visit we will go over the rest of the schedule which will be once a week for 4 weeks then once a month for the next 6 months.  Avoid exposing it to the direct sunlight.        BLEEDING  Your bleeding may continue, slightly to moderately, for 1-6 weeks following delivery.  You may notice an increase in your flow as you become more active, or after breastfeeding.  You may also pass some clots.  If there is any heavy bleeding, let us know.  Do NOT use tampons until you have your first period.  Your first period may be heavier than normal.  It is not unusual NOT to have menstrual periods until you stop breast feeding.  SWELLING OF ANKLES  You may notice swelling of ankles/feet after delivery that may be even worse than before you delivered, especially after a cesarean section.  This is normal and will gradually go away as you increase your activity and remain well hydrated    CONSTIPATION  You may experience constipation after the birth of your baby.  Increase the fiber in your diet:  bran cereal, green vegetables, fresh fruit, prunes/prune juice.  Metamucil or miralax or milk of magnesia may be taken according to package instructions.  Colace or Pericolace stool softeners may be taken 1-2/day until normal bowel movements.    HEMORRHOIDS  Hemorrhoids that develop during pregnancy or with delivery may cause you pain, itching or bleeding.  They will respond to sitzbaths and witch hazel pads.  Preparation H or Anusol HC may also be helpful.  Prevention of constipation and avoidance of straining with bowel movements is  essential    AFTERBIRTH PAINS  The cramps you experience after delivery should diminish over the first 48 hours after delivery.  You may experience an increase in your cramps with breastfeeding and increased activity.  You can take ibuprofen and Tylenol.  If this is your second/third+ baby your afterbirth pains may be more intense.    EPISIOTOMY CARE  Your episiotomy or laceration has been repaired with absorbable sutures which will dissolve over the next 6weeks.  Sitzbaths 3-4 times/day for the first two weeks will help with the healing of the stitches.  May use ~1tablespoon epsom salt in sitz bath.  Use dermoplast/witch hazel pads for relief of pain.   Ibuprofen or Tylenol can also be used.  Avoid constipation or straining with bowel movements.  Occasionally, a small stitch may pass during bathing.  If at any time the site of your stitches becomes excessively reddened, worsens in pain, drainage of pus occurs, foul odor develops or you have a fever >101, call us.    MOODS  Your emotions may fluctuate widely after the baby arrives.  Lack of sleep may continue to make you feel tired, irritable and depressed.  Get rest as you are able; nap when the baby naps.  Sometimes extra assistance at home will allow you to rest more.  Getting out and being with friends will be helpful.  If your depression is getting worse, contact the office.            A GUIDE TO PELVIC MUSCLE EXERCISES (KEGELS)    Pelvic floor muscle exercises (Kegel's) improve bladder control by strengthening the pelvic floor muscle that supports the bladder. Most people who them correctly and long enough (at least 6 weeks) will note significant improvement or even cure. Improvement will last as long as you continue to do exercises.  To learn how to do the exercises, use one of these techniques to identify the pelvic floor muscle.     Start to void. Once the urine stream has started, try to stop it. If the flow of urine stops, even slightly, the Correct  muscle has been identified.     Another technique is to squeeze the muscle in your rectum (not buttock muscle) that would prevent you from passing gas. If you feel a "pulling" or -tightening sensation, you are using the correct muscle.     A woman also can identify the muscle by inserting a finger into her vagina and tightening her vagina around it. If she can feel this tightening, the correct muscle has been identified. Notes  Once the correct muscle has been identified, you should not continue to squeeze it during urination.     You should squeeze the muscle just before you know a stress is coming (like sneezing, coughing, or bending over).     From now on, only do exercises when you are not voiding.   Regardless of how you learn to exercise the pelvic muscle, it is important to place a hand on your stomach to make sure the stomach muscle is not being contracted. Also, it is important to make sure that your buttock muscle is not being squeezed and that you remember to breathe. Once the proper muscle has been identified and can be squeezed without using the stomach or buttock muscle, you are ready to begin, in any position.   Daily exercise has two functions. It help's control the pelvic muscle and it increases its strength. Then this muscle can be used to gain control over urine loss.   Each exercise consists of squeezing and then relaxing the pelvic floor muscle. Squeeze the muscle for 3 seconds, and then relax it for 3 seconds. A squeeze and a relax is considered one exercise.   Do 45 exercises each day, divided into three sessions of 15 exercises each. The exact time of day is not critical, but finding time to do them each day is. Like any type of muscle training, these exercises take time to work. There is seldom much improvement before 6 weeks.   There are three reasons people find pelvic floor muscle exercises not helpful the correct muscle is not identified, the wrong muscle is exercised (eg. the  buttock or stomach muscle), or the exercises are not pursued long enough to achieve benefit.     HOW DO I DO KEGELS? BRIEF VERSION:    You can do Kegels anywhere: while you sit at a desk, wait for a bus, wash dishes, drive a car, wait in line, or watch TV. No one will know you are doing them.   Here's how you do them:    You can feel the muscles that need to be exercised by squeezing the muscles in your genital area. It might help to pretend you are stopping a flow of urine or trying to stop from passing gas.    Tighten these muscles and hold for 3 to 5 seconds. Then relax the muscles completely. Tighten and relax these muscles at least 10 to 20 times each day. Do these sets of exercises 10 times a day.    Do not do these exercises while you are urinating or having a bowel movement.   You will probably have less leaking of urine after doing these exercises every day for 3 to 6 months. It is important to keep doing Kegels the rest of your life.    Developed by [employer]. Published by [employer]. Last modified: 2009,08-21  Last reviewed: 2007-11-02  This content is reviewed periodically and is subject to change as new health information becomes available. The information is intended to inform and educate and is not a replacement for medical evaluation, advice, diagnosis or treatment by a health care professional. A'   2009 [employer] and/or its affiliates. All Rights Reserved

## 2018-07-11 NOTE — Anesthesia Procedure Notes (Addendum)
Spinal      Patient location during procedure: OR  Reason for block: Primary Anesthesia In the OR    Block at Surgeon's request: Yes      Start time: 07/11/2018 11:15 AM    End time: 07/11/2018 11:20 AM    Staffing  Anesthesiologist: Silvio Pate, MD  Resident/CRNA: Stark Jock, DO  Performed: resident/CRNA       Pre-procedure Checklist   Completed: patient identified, surgical consent, pre-op evaluation, timeout performed, risks and benefits discussed, monitors and equipment checked, anesthesia consent given and correct site      Spinal  Patient monitoring: pulse oximetry and NIBP    Premedication: No    Patient position: sitting    Sterile Technique: povidone-iodine 7.5% surgical scrub, patient draped, mask used and wearing gloves  Skin Local: lidocaine 1%        Approach: midline  Number of attempts: 1      Needle Placement    Needle gauge: 25      Intrathecal space entered at L3-4        Paresthesia Pain: right and transient    Catheter Placement   CSF Return: Yes  Blood Return: No              Assessment   Sensory level: T4 and Bilateral  Block Outcome: patient tolerated procedure well, no complications and successful block

## 2018-07-11 NOTE — Plan of Care (Signed)
Kairi Truc states understanding POC. Repeat c/s x1. Denies questions or confusion. Will continue to monitor.  Problem: Vaginal/Cesarean Delivery  Goal: Maternal Status within defined parameters  Outcome: Progressing  Flowsheets (Taken 07/11/2018 1017)  Maternal status with defined parameters : Monitor/assess vital signs; Perform physical assessment per phase of care; Manage complications/co-morbidities per LIP orders  Note:   Vitals WNLs and afebrile  Goal: Evidence of Fetal Well Being  Outcome: Progressing  Flowsheets (Taken 07/11/2018 1017)  Evidence of fetal well being: Monitor/assess fetal heart rate. Notify LIP of Category II or III EFM tracings.; Initiate interventions for Category II or III EFM strip; Position patient for maximum uterine perfusion; Patient's position should support labor progress and expulsion efforts  Note:   EFM category 1. Patient states good fetal movement.  Goal: Free from Maternal/Fetal Infection  Outcome: Progressing  Flowsheets (Taken 07/11/2018 1017)  Free from Maternal/Fetal Infection : Assess for infection risk using the appropriate screening tool; Determine GBS status. If GBS positive, manage per LIP orders.  Goal: Intrapartum management of pain/discomfort  Outcome: Progressing  Flowsheets (Taken 07/11/2018 1017)  Intrapartum management of pain/discomfort : Keep pain at acceptable level for patient; Assess pain using a consistent, developmental/age appropriate pain scale; Assess pain level before and following intervention; Monitor hemodynamic parameters in response to pain/sedation medications; Assess and manage side effects associated with pain management; Include patient/patient care companion in decisions related to pain management; Report ineffective pain management to LIP; Consult/collaborate with Anesthesia or Pain Service  Note:   Pain 0/10.

## 2018-07-12 LAB — CBC AND DIFFERENTIAL
Absolute NRBC: 0 10*3/uL (ref 0.00–0.00)
Basophils Absolute Automated: 0.03 10*3/uL (ref 0.00–0.08)
Basophils Automated: 0.3 %
Eosinophils Absolute Automated: 0.11 10*3/uL (ref 0.00–0.44)
Eosinophils Automated: 0.9 %
Hematocrit: 36 % (ref 34.7–43.7)
Hgb: 11.6 g/dL (ref 11.4–14.8)
Immature Granulocytes Absolute: 0.04 10*3/uL (ref 0.00–0.07)
Immature Granulocytes: 0.3 %
Lymphocytes Absolute Automated: 1.9 10*3/uL (ref 0.42–3.22)
Lymphocytes Automated: 16.3 %
MCH: 29.4 pg (ref 25.1–33.5)
MCHC: 32.2 g/dL (ref 31.5–35.8)
MCV: 91.1 fL (ref 78.0–96.0)
MPV: 10 fL (ref 8.9–12.5)
Monocytes Absolute Automated: 1 10*3/uL — ABNORMAL HIGH (ref 0.21–0.85)
Monocytes: 8.6 %
Neutrophils Absolute: 8.61 10*3/uL — ABNORMAL HIGH (ref 1.10–6.33)
Neutrophils: 73.6 %
Nucleated RBC: 0 /100 WBC (ref 0.0–0.0)
Platelets: 225 10*3/uL (ref 142–346)
RBC: 3.95 10*6/uL (ref 3.90–5.10)
RDW: 16 % — ABNORMAL HIGH (ref 11–15)
WBC: 11.69 10*3/uL — ABNORMAL HIGH (ref 3.10–9.50)

## 2018-07-12 MED ORDER — OXYCODONE HCL 5 MG PO TABS
10.0000 mg | ORAL_TABLET | Freq: Four times a day (QID) | ORAL | Status: DC | PRN
Start: 2018-07-12 — End: 2018-07-13

## 2018-07-12 MED ORDER — ACETAMINOPHEN 500 MG PO TABS
1000.0000 mg | ORAL_TABLET | Freq: Four times a day (QID) | ORAL | Status: AC
Start: 2018-07-12 — End: 2018-07-13
  Administered 2018-07-12 – 2018-07-13 (×4): 1000 mg via ORAL
  Filled 2018-07-12 (×4): qty 2

## 2018-07-12 MED ORDER — SIMETHICONE 80 MG PO CHEW
160.0000 mg | CHEWABLE_TABLET | Freq: Three times a day (TID) | ORAL | Status: DC
Start: 2018-07-12 — End: 2018-07-13

## 2018-07-12 NOTE — Lactation Note (Signed)
Initial Lactation Visit:  [id number]  Delivered: 07/11/2018 11:36 AM   Female  via Cesarean  at [redacted]w[redacted]d    Birth Weight: 7 lb 6.5 oz (3360 g)   Feeding Type:      APGAR: 8   and 9      Allergies   Allergen Reactions    Ciprofloxacin Rash     Past Medical History:   Diagnosis Date    Oligohydramnios antepartum 2016    with previous pregnancy    Pregnant state, incidental      Past Surgical History:   Procedure Laterality Date    AUGMENTATION MAMMAPLASTY      BREAST IMPLANT  05/2010    CESAREAN SECTION N/A 06/20/2014    Procedure: CESAREAN SECTION;  Surgeon: Patriciaann Clan, MD;  Location: [facility] OR;  Service: Obstetrics;  Laterality: N/A;    CESAREAN SECTION     I visited with the mother to offer assistance with bf. The baby was positioned on the right side in the [facility].  The baby snapped on her nipple.   I showed the mother how to hold the baby in the [facility] to latch the baby, roll her nipple over the tip of the baby's tongue for a better deeper latch and then switch to the [facility] once the baby is sustaining the latch.  The mother said the latch felt comfortable  I recommended the mother give the baby more space for the chin to touch the breast by moving her hand back by the chest wall.  I instructed in breast compression and how to include with bf to help increase the milk flow.  I reviewed breastfeeding basics ;              Encouraged  STS, watch for feeding cues (hands to the mouth and mouth moving), encouraged frequent effective deep latch at least g 3 hours.  Signs of effective feeding discussed.                   Watch for swallowing,movement back to ears, cheeks puffing out.  I reinforced the importance of monitoring wet diapers, stools and weight for the effectiveness of the feeding.  Contact number given for f/u help if desires.      Mother's breastfeeding Plan of Care.  Lactation consultation completed. POC reviewed with the mother.  The mother verbalizes understanding and continues to progress.  Mother is  exclusively breastfeeding her baby: Yes   Mother is feeding formula: no  Mother is pumping: no  Mother supplementing:. no

## 2018-07-12 NOTE — Progress Notes (Signed)
Assisted mom to latch baby on the L breast in football hold. Mom was complaining that baby was not latching deeply. Her nipples appeared to be red and sensitive with an asymmetric lipstick shape when baby came off the breast before getting RN assistance. Reviewed techniques to latching baby deeply on the breast including waiting for baby to open her mouth wide and proper hand placement. Baby was able to latch deeply with minimal discomfort on the L breast. Mom to call with further questions or concerns.

## 2018-07-12 NOTE — Progress Notes (Signed)
POD#1 s/p CS  Dec lochia, +flatus, tol. Reg diet and voiding without difficulty, min ambulation, pain controlled  Temp:  [97.9 F (36.6 C)-98.8 F (37.1 C)] 97.9 F (36.6 C)  Heart Rate:  [70-86] 79  Resp Rate:  [16] 16  BP: (91-110)/(52-68) 95/60  RRR  CTAB  NABS soft, ND, FF, incision c/d/i  No calf edema/TTP  POD#1 s/p CS  Hl IV  Adv diet as tolerating  Devon IV once [place] [id number]

## 2018-07-12 NOTE — Plan of Care (Signed)
Problem: Safety  Goal: Patient will be free from injury during hospitalization  Outcome: Progressing  Flowsheets (Taken 07/12/2018 2038)  Patient will be free from injury during hospitalization : Assess patient's risk for falls and implement fall prevention plan of care per policy; Ensure appropriate safety devices are available at the bedside; Use appropriate transfer methods; Provide and maintain safe environment; Include patient/ family/ care giver in decisions related to safety; Hourly rounding; Assess for patients risk for elopement and implement Elopement Risk Plan per policy     Problem: Safety  Goal: Patient will be free from infection during hospitalization  Outcome: Progressing  Flowsheets (Taken 07/12/2018 2038)  Free from Infection during hospitalization: Assess and monitor for signs and symptoms of infection; Monitor lab/diagnostic results; Monitor all insertion sites (i.e. indwelling lines, tubes, urinary catheters, and drains)     Problem: Vaginal/Cesarean Delivery  Goal: Maternal Status within defined parameters  Outcome: Progressing  Flowsheets (Taken 07/12/2018 2038)  Maternal status with defined parameters : Monitor/assess vital signs; Perform physical assessment per phase of care     Problem: Vaginal/Cesarean Delivery  Goal: Postpartum management of pain/discomfort  Outcome: Progressing  Flowsheets (Taken 07/12/2018 2038)  Postpartum management of pain/discomfort: Assess pain using a consistent, developmental/age appropriate pain scale; Assess pain level before and following intervention     Problem: Vaginal/Cesarean Delivery  Goal: Uterine management  Outcome: Progressing  Flowsheets (Taken 07/12/2018 2038)  Uterine management: Assess fundus and notify LIP if not firm, midline, or at or below the umbilicus, or if abdomen is abnormally distended   Patient assessment and VS WNL.  Patient's pain managed well at this time.  Patient is able to ambulate independently.  Educated on POC for evening to  include patient safety, discharge planning and pain management.  Patient verbalizes understanding and has no further questions at this time.  Will continue to round to assess patient needs and safety.

## 2018-07-12 NOTE — Plan of Care (Signed)
Problem: Vaginal/Cesarean Delivery  Goal: Maternal Status within defined parameters  Outcome: Progressing  Flowsheets (Taken 07/12/2018 0449)  Maternal status with defined parameters : Monitor/assess vital signs; Perform physical assessment per phase of care; Manage complications/co-morbidities per LIP orders  Goal: Evidence of Fetal Well Being  Outcome: Completed  Goal: Free from Maternal/Fetal Infection  Outcome: Completed  Goal: Intrapartum management of pain/discomfort  Outcome: Completed  Goal: Postpartum management of pain/discomfort  Outcome: Progressing  Flowsheets (Taken 07/12/2018 0449)  Postpartum management of pain/discomfort: Assess pain using a consistent, developmental/age appropriate pain scale; Assess pain level before and following intervention; Include patient/patient care companion in decisions related to pain management; Monitor for post anesthesia issues related to pain management; Offer non-pharmacologic pain management interventions  Goal: Breasts are soft with nipple integrity intact  Outcome: Progressing  Flowsheets (Taken 07/12/2018 0449)  Breasts are soft with nipple integrity intact: Assess and manage engorgement; Breastfeed and/or pump breasts at least 8-12 times within 24 hours; Perform breast/nipple assessment; Ensure proper positioning and latch  Goal: Gastrointestinal/Urinary management  Outcome: Progressing  Flowsheets (Taken 07/12/2018 0449)  Gastrointestinal/Urinary management: Use CAUTI prevention techniques; Maintain urine output of 30/mL per hour or greater; Monitor intake and output per orders; Adequate bladder emptying per phase of care  Goal: Uterine management  Outcome: Progressing  Flowsheets (Taken 07/12/2018 0449)  Uterine management: Assess fundus and notify LIP if not firm, midline, or at or below the umbilicus, or if abdomen is abnormally distended; Assess for hemorrhage risk using appropriate screening tool  Goal: Incision will be clean, dry, and intact and without  discharge or hematoma  Outcome: Progressing  Flowsheets (Taken 07/12/2018 0449)  Incision will be clean, dry and intact and without discharge or hematoma: Monitor incision for signs of infections; Assess abdominal dressing and incision; Assess incision for bleeding/hematoma; Assess incision for evidence of healing  Goal: VTE Prevention  Outcome: Progressing  Flowsheets (Taken 07/12/2018 0449)  VTE prevention: Patient ambulating independently; Assess skin color, turgor, peripheral pulses, perfusion, and presence of edema, e.g. capillary refill; Patient ambulating with assistance  Goal: Evidence of positive mother-baby interactions  Outcome: Progressing  Flowsheets (Taken 07/12/2018 0449)  Evidence of positive mother-baby interactions: Initiate skin to skin; Initiate safety and falls prevention interventions; Assess emotional status and coping mechanisms; Include patient/patient care companion in decisions related to care; Encourage rooming in and infant feeding on demand; Ensure parent/caregiver provides infant care; Assess parent/caregiver engagement and awareness of infant cues/behavior     Mother is in stable condition. Pain has been controlled with Ibuprofen and tylenol. Patient has been out of bed with assistance twice without incident. Patient educated on getting out of bed safely and to call for assistance if she feels unsteady for any reason. Patient's foley is due to come out later this morning. Discussed POC for the night and patient agrees. Patient will call with questions, concerns, or changes in condition.

## 2018-07-12 NOTE — Progress Notes (Signed)
Patient noted to have her dressing saturated with bright red blood at the second check assessment. Dressing was removed and did not appear to be actively bleeding or oozing. Incision was redressed with a new tefla, abd dressing and tape. MD Kiszkiel notified and no further orders at this time

## 2018-07-13 ENCOUNTER — Encounter (HOSPITAL_BASED_OUTPATIENT_CLINIC_OR_DEPARTMENT_OTHER): Payer: Self-pay | Admitting: Obstetrics & Gynecology

## 2018-07-13 MED ORDER — OXYCODONE HCL 5 MG PO TABS
5.0000 mg | ORAL_TABLET | ORAL | 0 refills | Status: AC | PRN
Start: 2018-07-13 — End: 2018-07-20

## 2018-07-13 NOTE — Progress Notes (Signed)
Post Operative Note    Without complaints today.  No nausea or vomiting.  No fever or chills.  Pain well tolerated.  No dysuria. Voiding well, passing gas.  Breastfeeding well.    Exam: Afebrile, VSS   Abdomen soft, nt/nd   Fundus firm   Incision c/d/i   No C/C/+1Edema    S/P Cesarean Section, POD#2   Doing well   Routine PO care.   Desires d/c home   Rx and Instructions given

## 2018-07-13 NOTE — Discharge Summary (Signed)
Obstetrics Discharge Summary    Delivery:  See Delivery Record    Puerperal Course: Normal    Discharge Examination: Normal    Diagnosis:  S/P LTCS    Instructions:    Pt has been given a discharge instruction sheet which includes diet & activity

## 2018-07-13 NOTE — Lactation Note (Signed)
I followed up with the mother to offer additional help with bf. The baby was sleeping when I arrived.  The mother did not need any help with bf at this visit. She said bf was improving.  The mother had questions about which bottles to use if she is giving supplements.  She told me she used the Dr. Manson Passey bottles with her other child.  She ,also, asked about a NS and if she should try one. I discussed using a NS the reasons and if she need one now.   I explained if the baby was bf well and transferring milk as evidenced in the wet diapers, stools and weight she probably did not need to use one. I told her there are reasons one may use the NS and her circumstances may change as she continues to bf and the need may have to be reevaluated. I suggested at this time I did not thinks she needed to use one since the baby was improving with bf.  I reinforced bf teaching, STS and encouraged frequent effective feeding at least every 3 hours based on feeding cues.   Contact number given for F/U help if desires.        Mother's breastfeeding Plan of Care.  Lactation consultation completed. POC reviewed with the mother.  The mother verbalizes understanding and continues to progress.  Mother is exclusively breastfeeding her baby: Yes   Mother is feeding formula:  no  Mother is pumping: no  Mother supplementing: no

## 2018-07-13 NOTE — Plan of Care (Signed)
Discharge education completed with patient by Schuyler Amor RN. Reviewed mediations and side effects, self care, and reasons to call MD. Incision care instructions given to patient. Will follow up with OB in 2 weeks.
